# Patient Record
Sex: Female | Born: 1965 | Race: White | Hispanic: No | Marital: Married
Health system: Midwestern US, Academic
[De-identification: ages and names within clinical notes are randomized; demographics above are authoritative.]

---

## 2016-03-21 IMAGING — US PEL
1 series · 14 of 25 positions shown · non-contrast
Comparison: none

[Series 1: us pelvic complete · 14 of 49 slices shown]
[im 1/49]
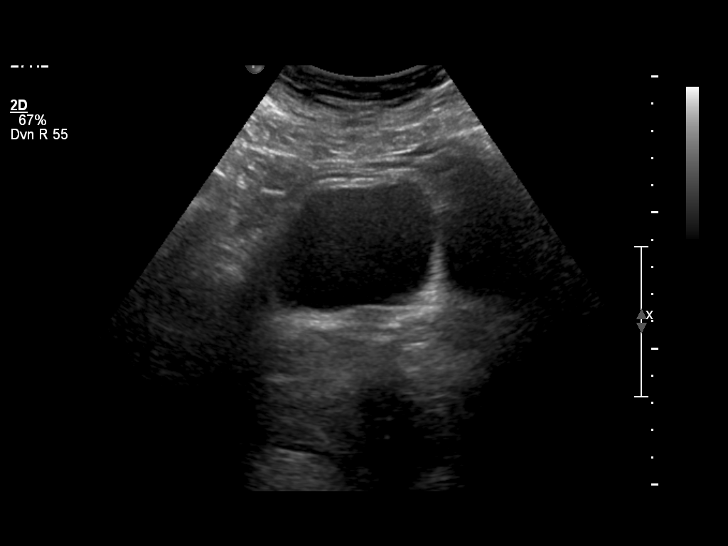
[im 5/49]
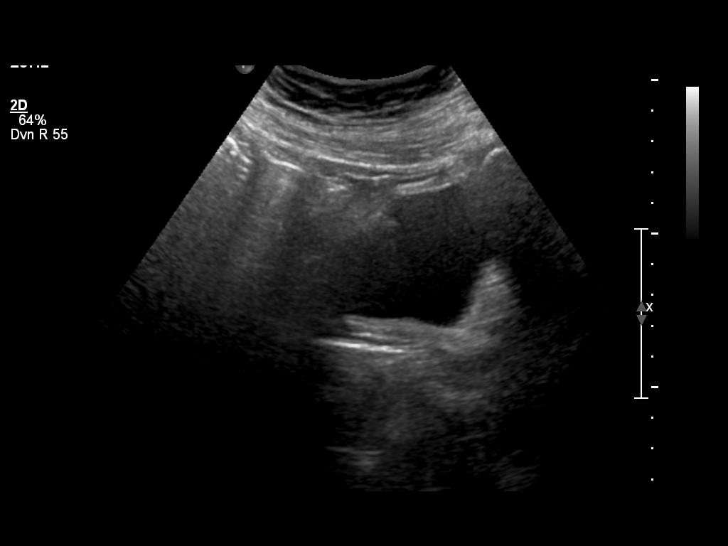
[im 9/49]
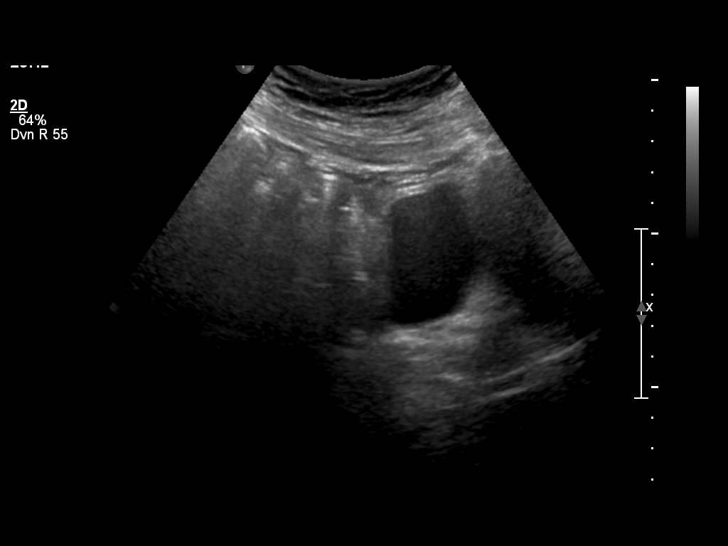
[im 13/49]
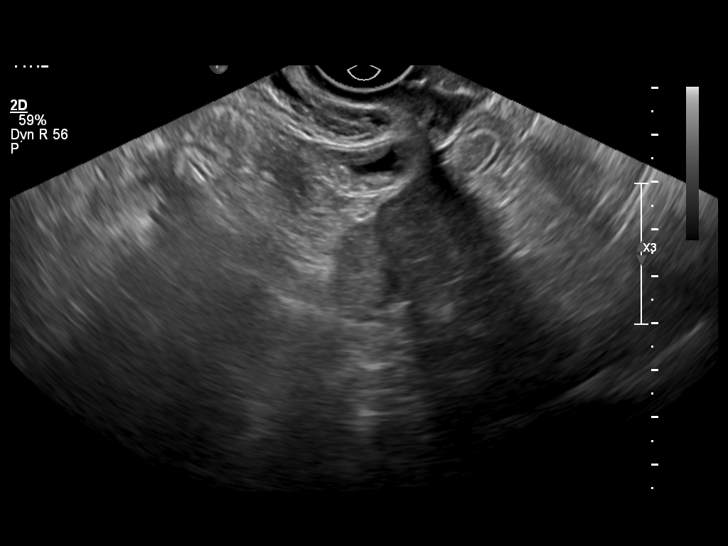
[im 17/49]
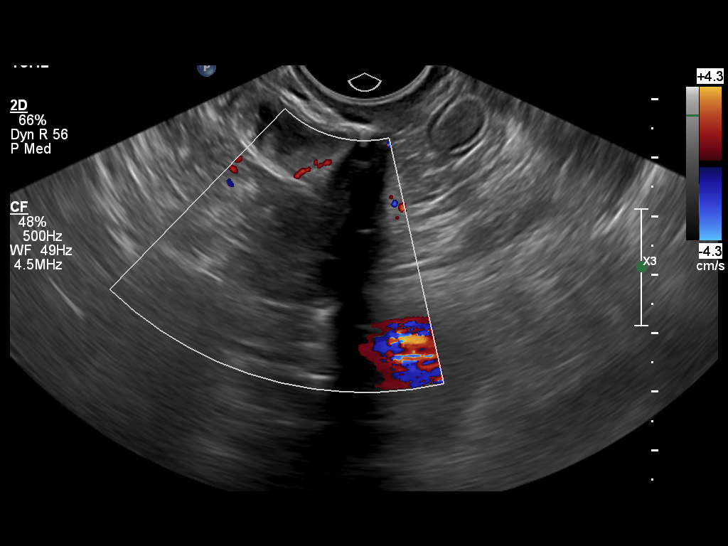
[im 19/49]
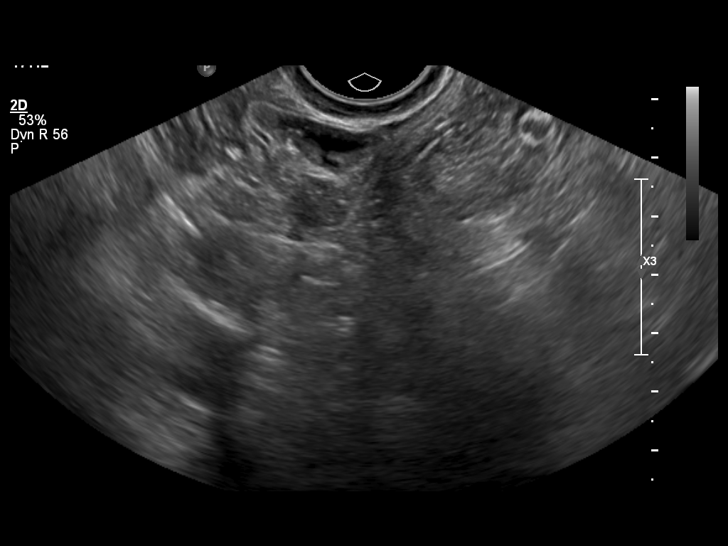
[im 23/49]
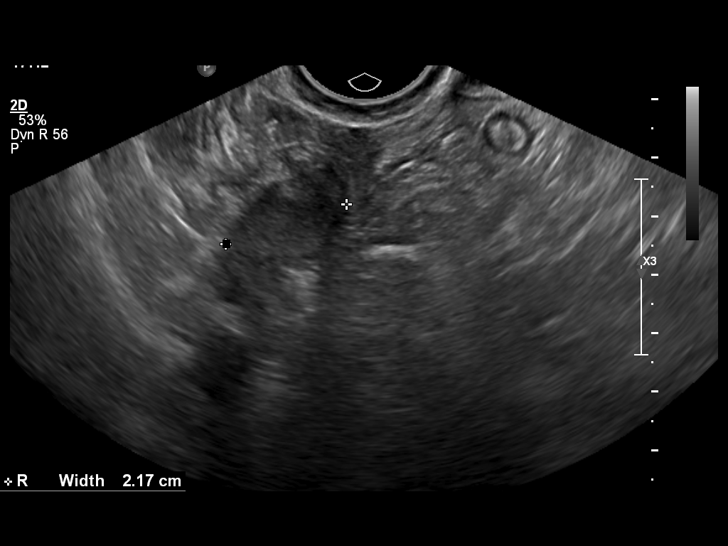
[im 27/49]
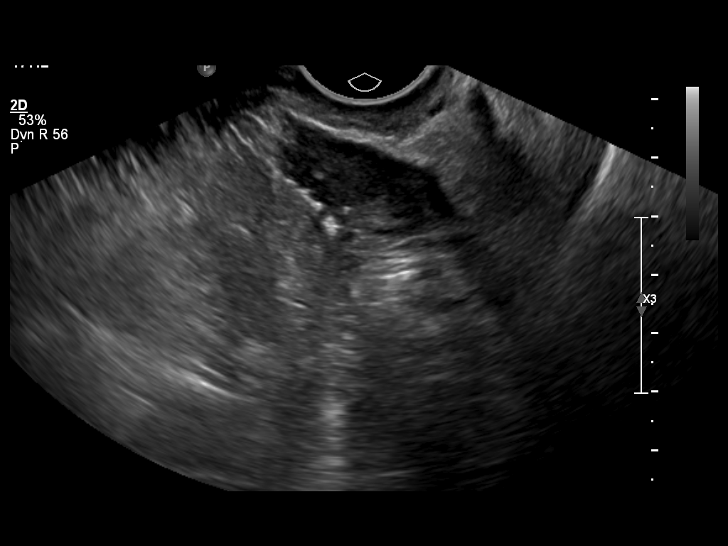
[im 31/49]
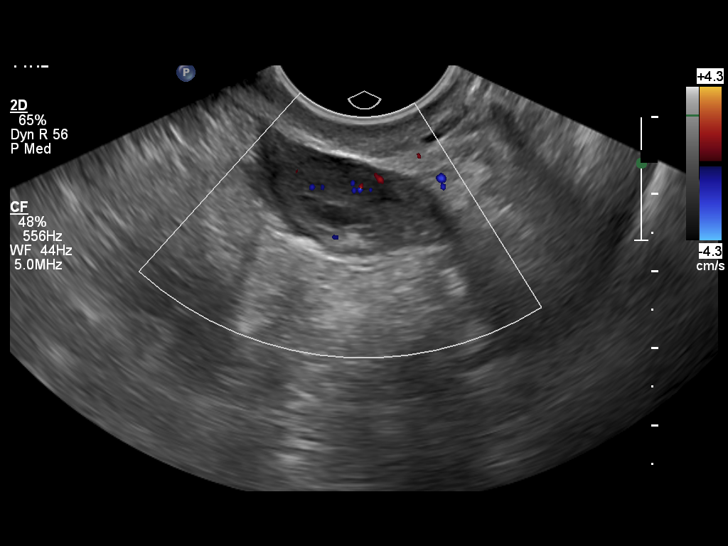
[im 33/49]
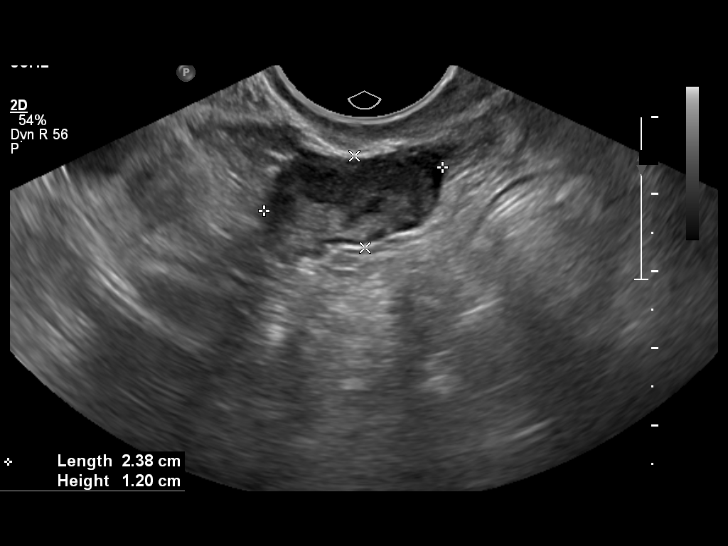
[im 37/49]
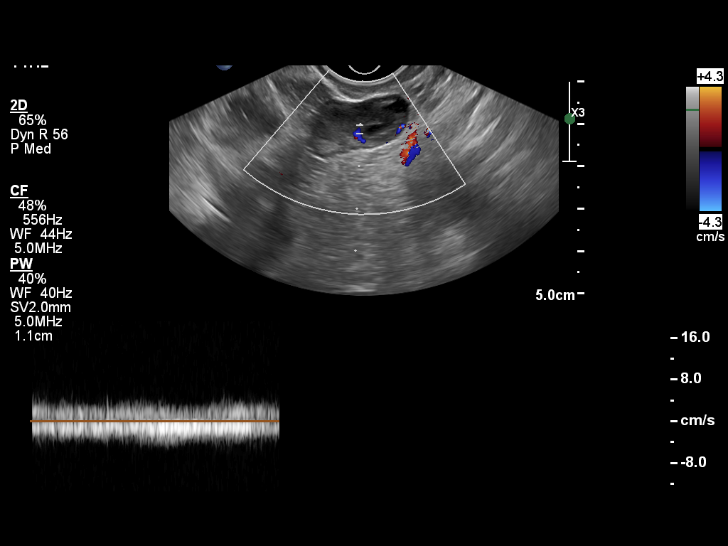
[im 41/49]
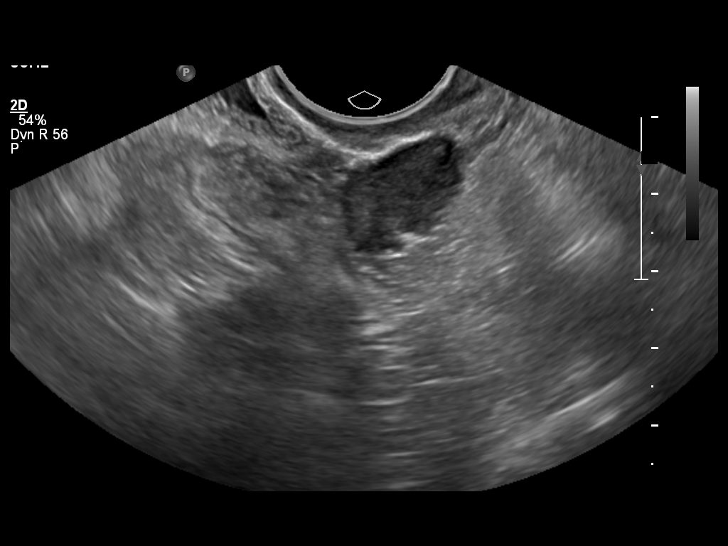
[im 45/49]
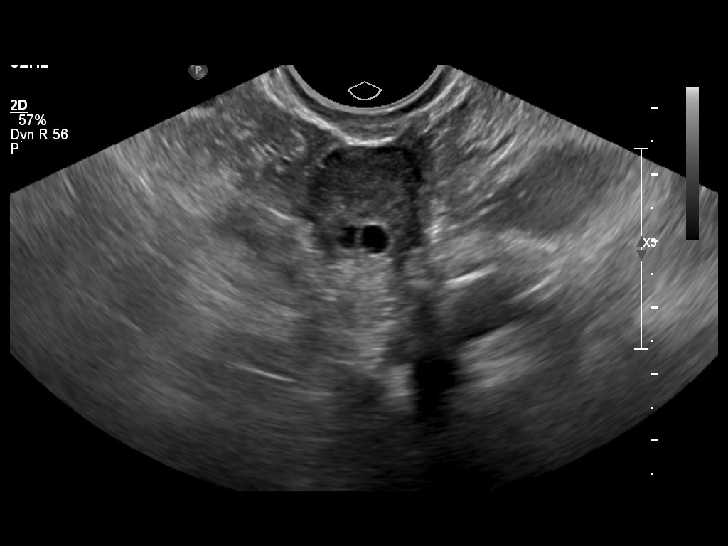
[im 49/49]
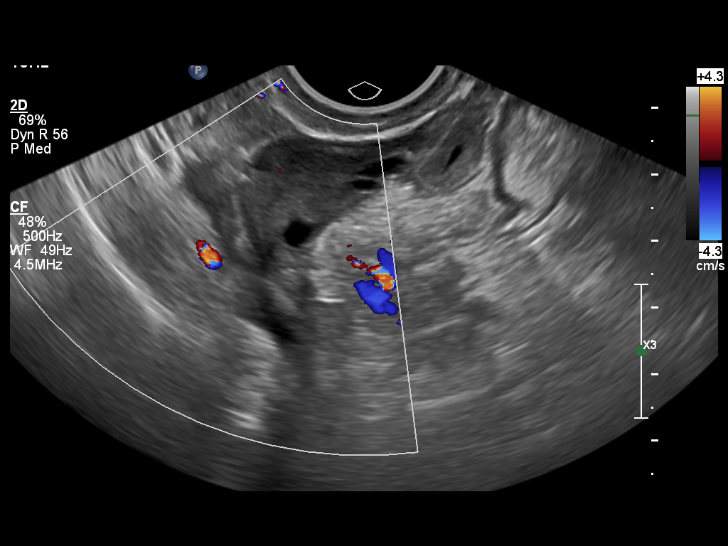

[14 of 25 positions shown; findings below may reference images not displayed]

ULTRASOUND REPORT

EXAM
ULTRASOUND, PELVIC (NONOBSTETRIC), REAL TIME WITH IMAGE DOCUMENTATION, COMPLETE, CPT 49119;
ULTRASOUND, TRANSVAGINAL, CPT 34475

INDICATION
PELVIC PAIN

TECHNIQUE
Multiple static grayscale and color Doppler ultrasound images provided from a transabdominal and
transvaginal pelvic ultrasound.

COMPARISONS
None

FINDINGS
The uterus is surgically absent. The right ovary measures5.8 x 2.2 x 2 cm. The left ovary
measures5.8 x 2.3 x 1.2cm . There are no solid or cystic masses identified in either ovary. There
are no adnexal masses. There is normal blood flow to the ovaries identified bilaterally.

IMPRESSION
Surgically absent uterus. Normal ovaries.

## 2017-03-22 IMAGING — CR ABDOMEN
2 series · 2 of 2 positions shown · non-contrast
Comparison: none

[abdomen upright]
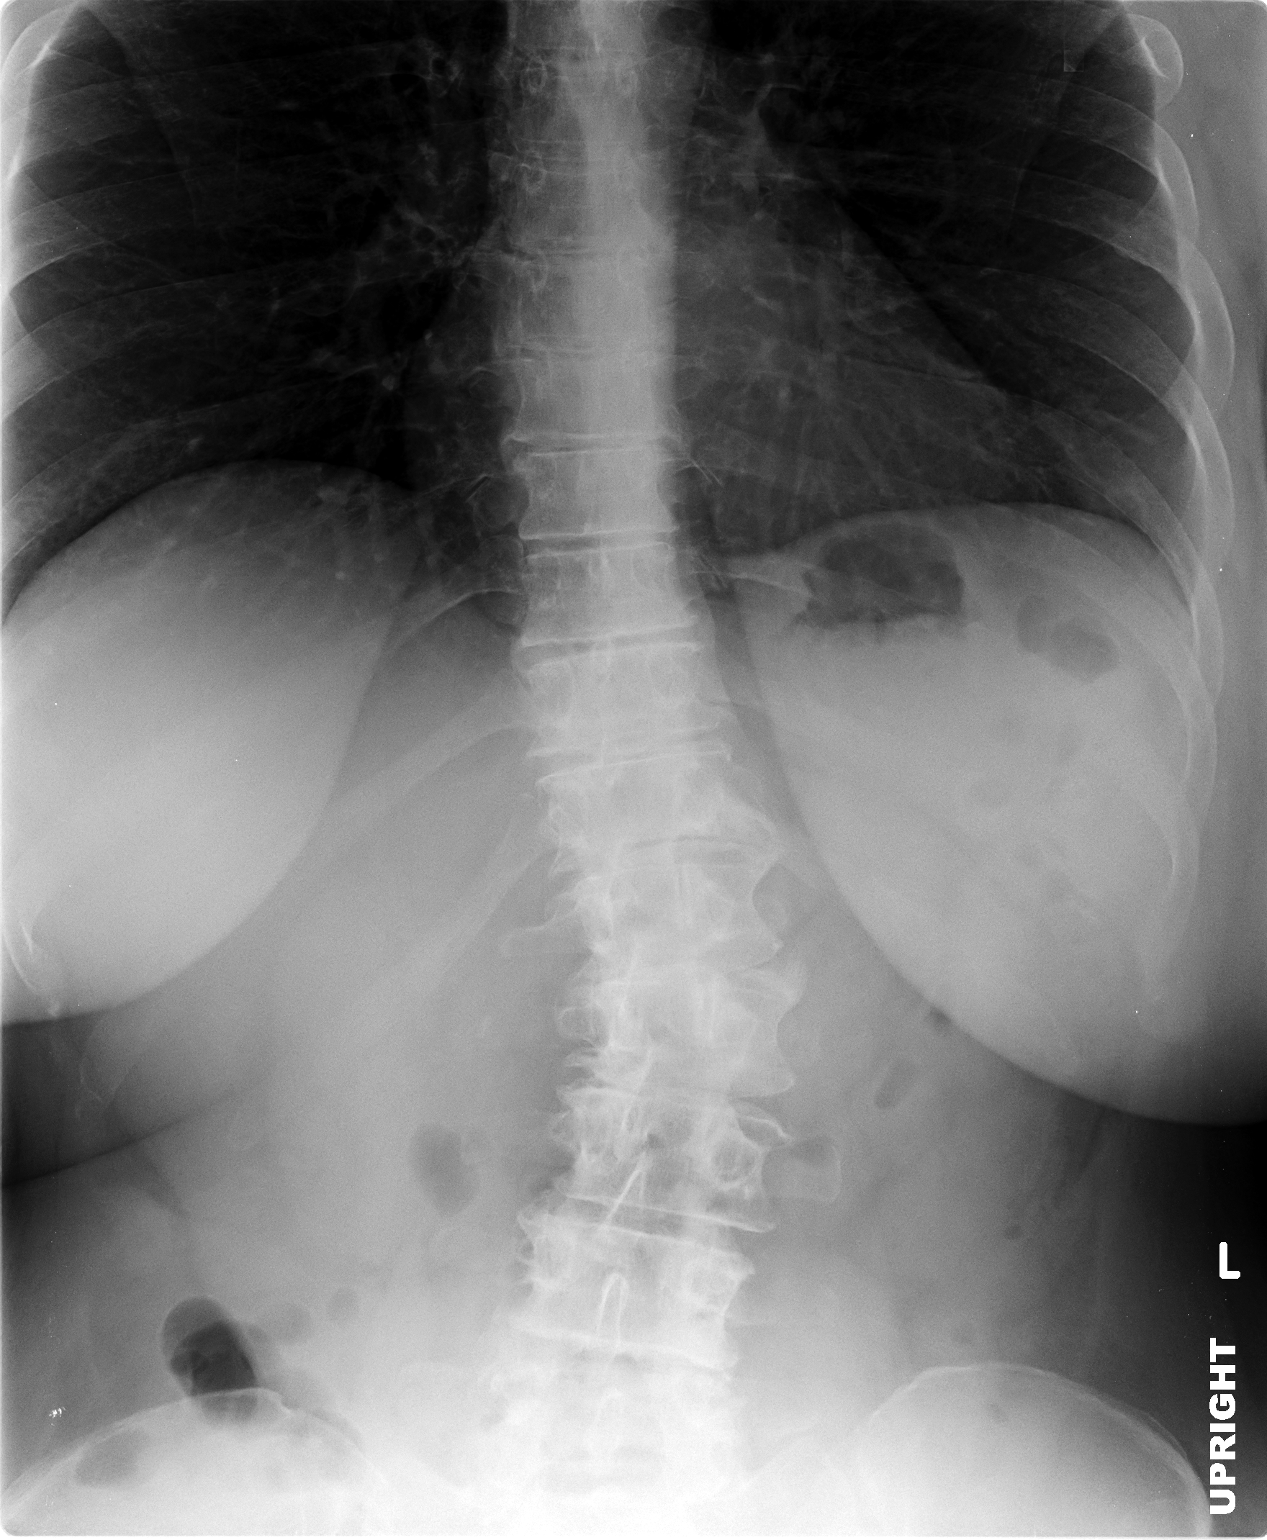

[abdomen supine kub]
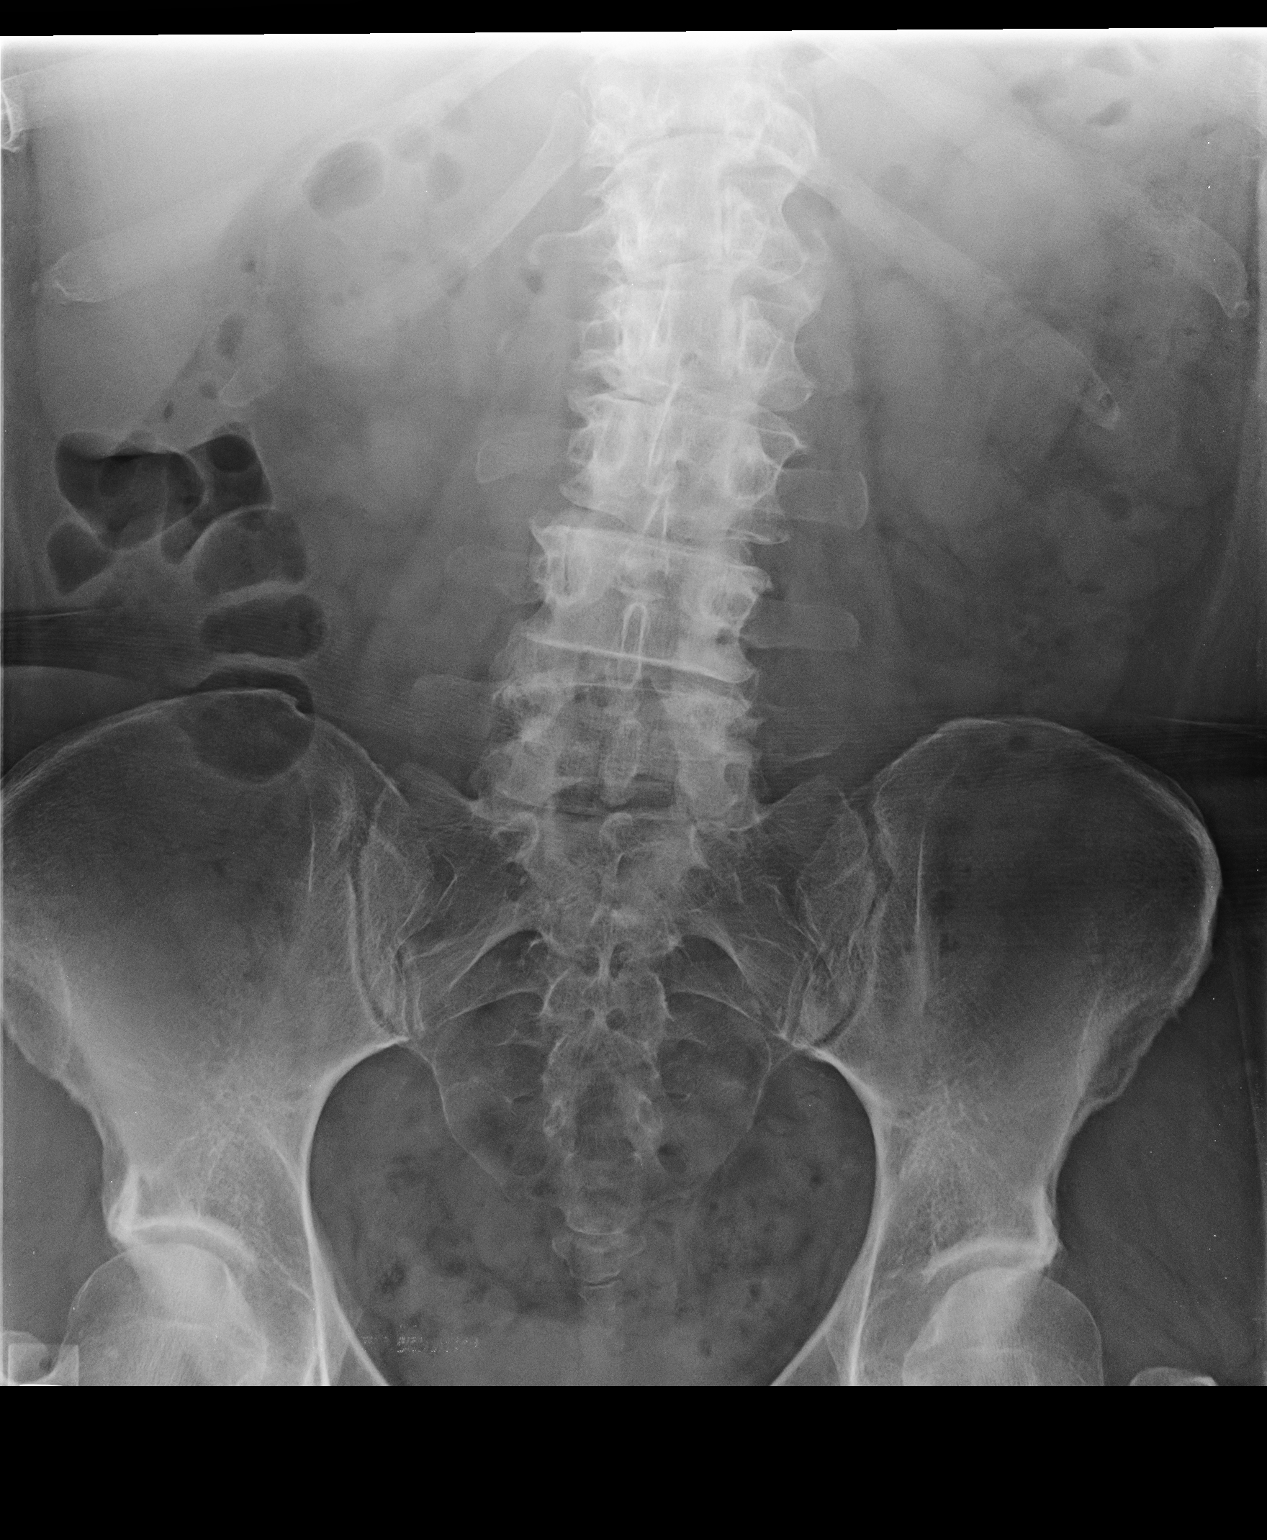

[2 of 2 positions shown; findings below may reference images not displayed]

EXAM

Two-view abdomen.

INDICATION

Pain.

FINDINGS

There is no evidence of obstruction or ileus. No free air is seen.

Stool is seen increased in the colon.

There is scoliosis and degenerative changes of the lumbar spine.

IMPRESSION

No obstruction or free air.

## 2018-01-15 IMAGING — CT Abdomen^1_ABDOMEN_PELVIS_WITH (Adult)
1 series · 15 of 32 positions shown, 19 images · IV contrast (APPLIED)
Comparison: none

[Series 2: abd/pelvis with 5.0 soft tissue · axial · 0.80mm/px · z∈[-457,-22]mm · 15 of 98 slices shown, 19 images]
[im 7/98  soft-tissue]
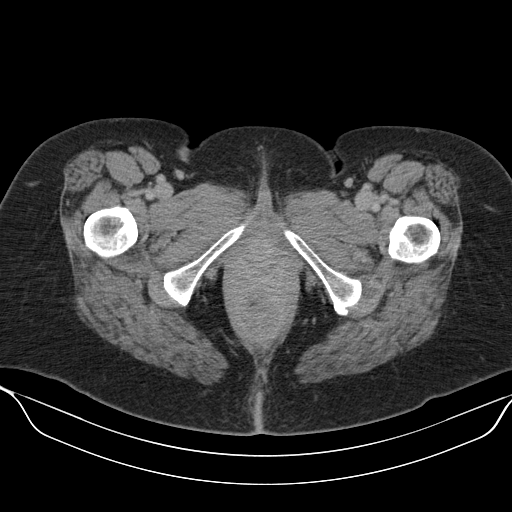
[im 7/98  bone]
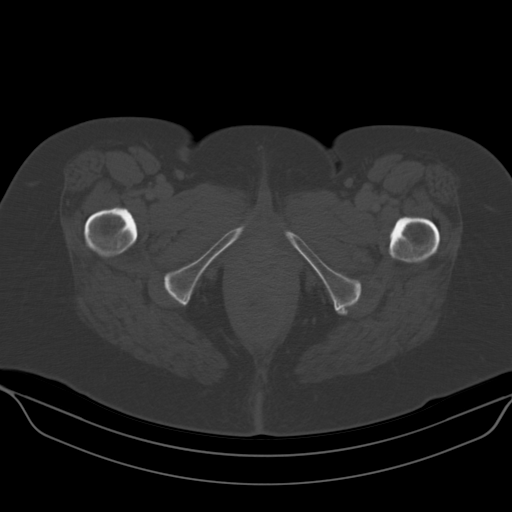
[im 13/98  soft-tissue]
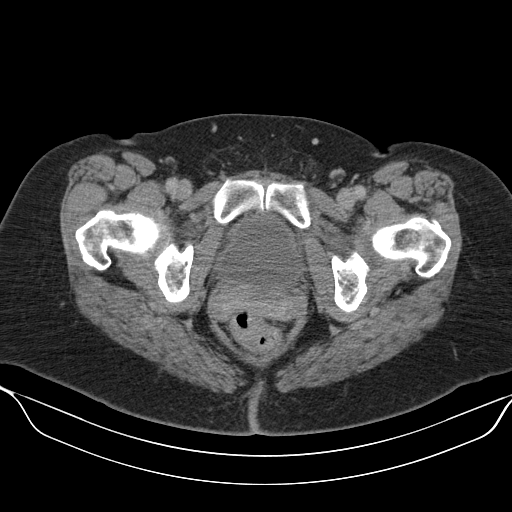
[im 19/98  soft-tissue]
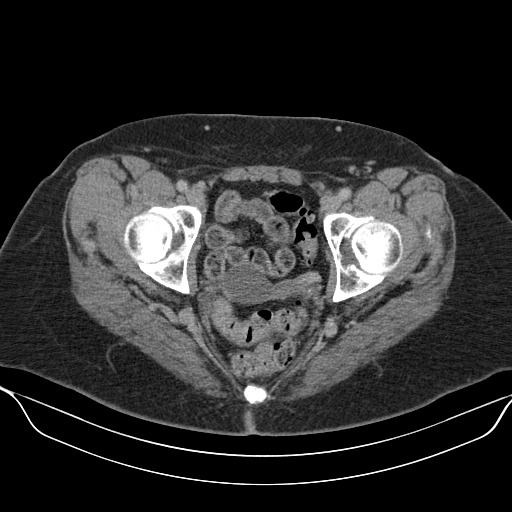
[im 29/98  soft-tissue]
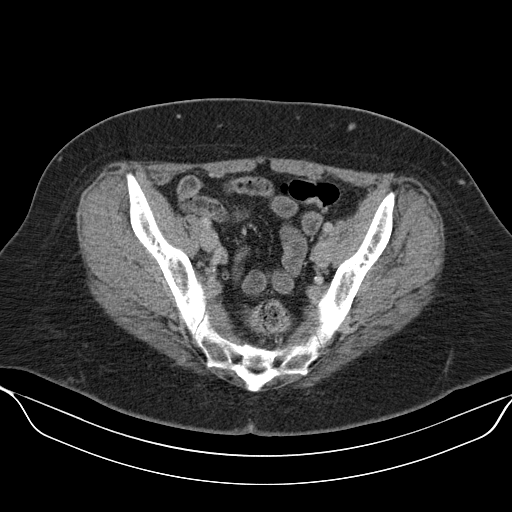
[im 35/98  soft-tissue]
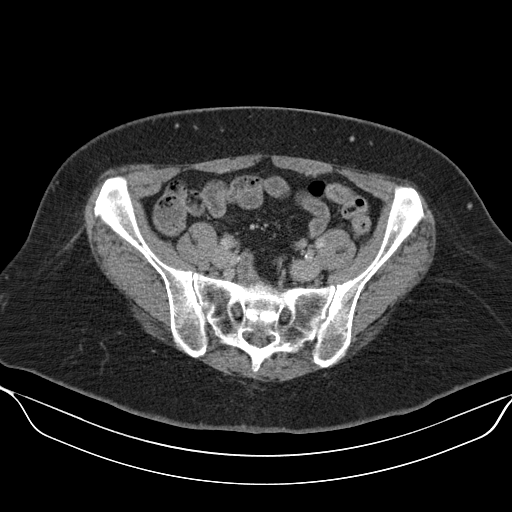
[im 41/98  soft-tissue]
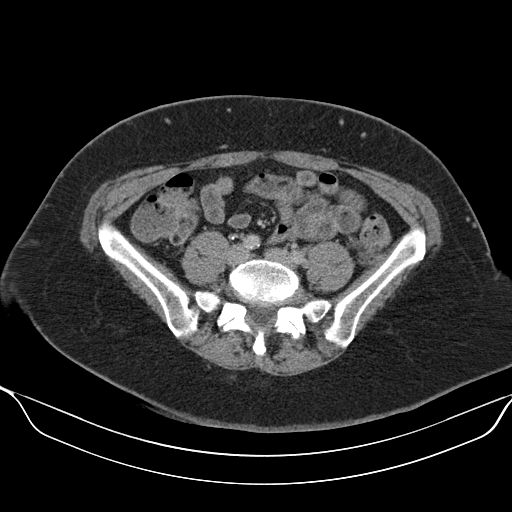
[im 51/98  soft-tissue]
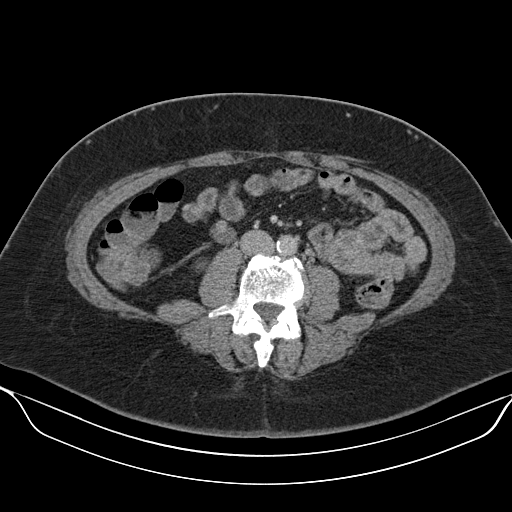
[im 57/98  soft-tissue]
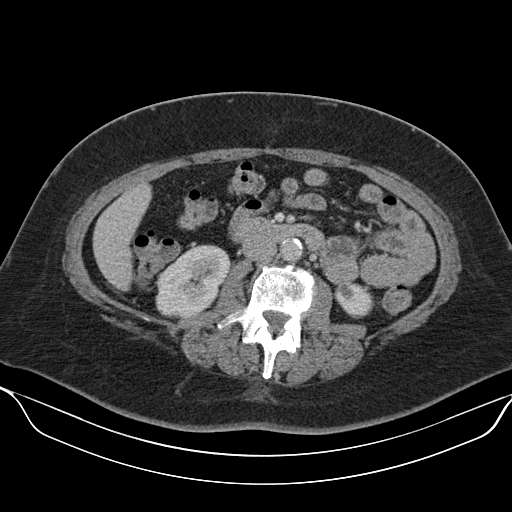
[im 63/98  soft-tissue]
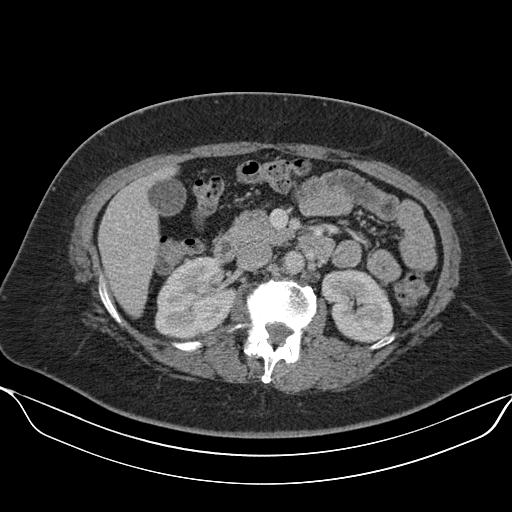
[im 63/98  bone]
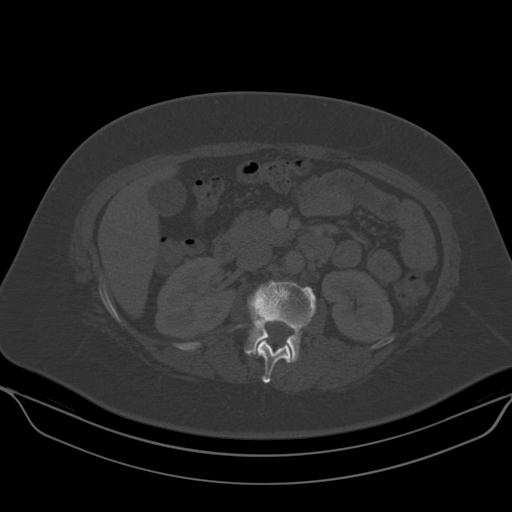
[im 69/98  soft-tissue]
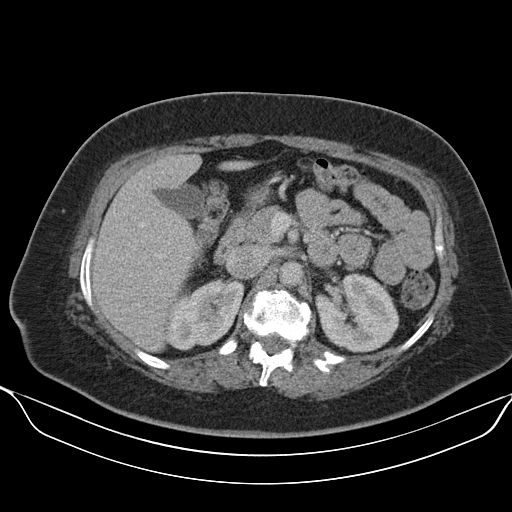
[im 79/98  soft-tissue]
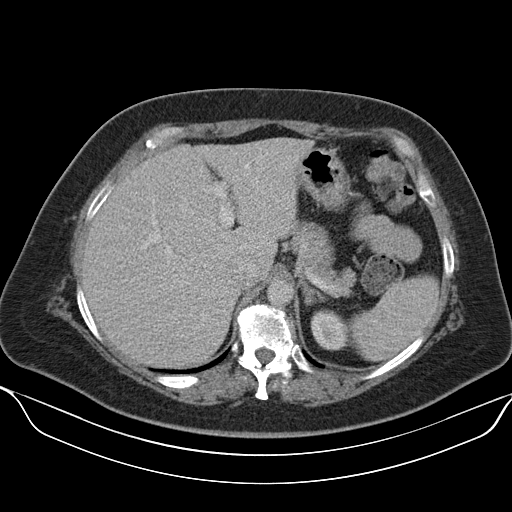
[im 85/98  soft-tissue]
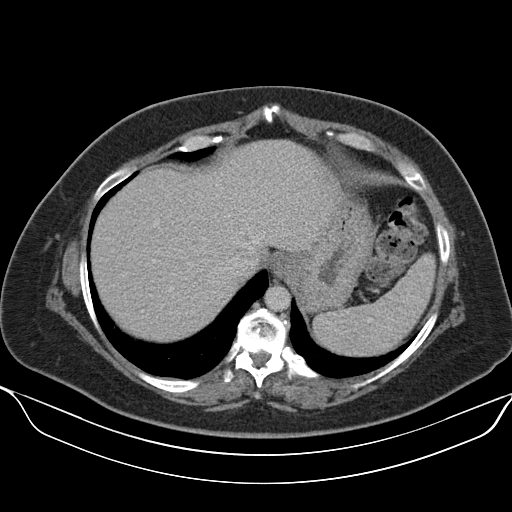
[im 85/98  lung]
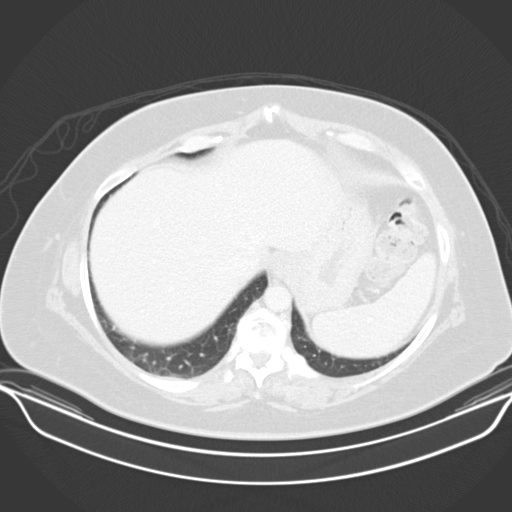
[im 88/98  lung]
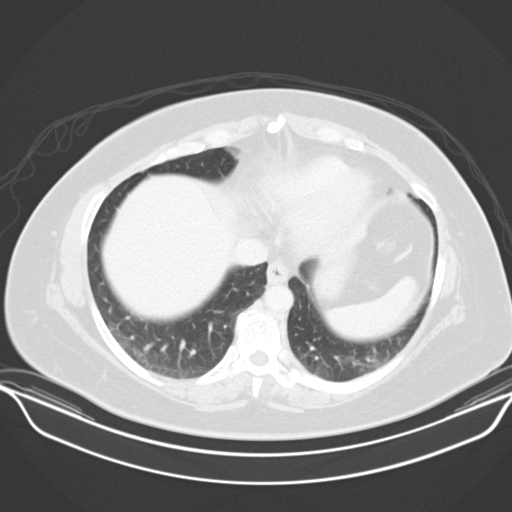
[im 91/98  soft-tissue]
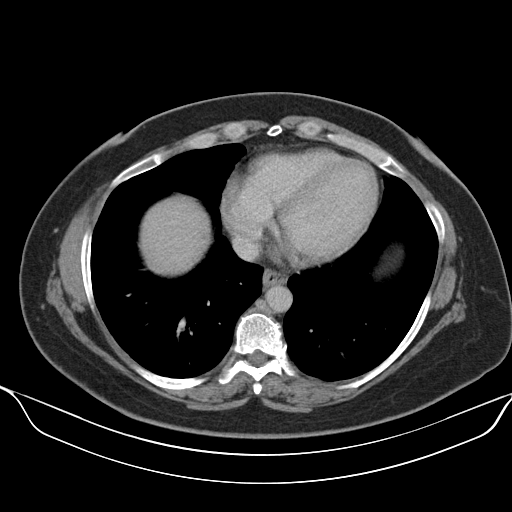
[im 91/98  lung]
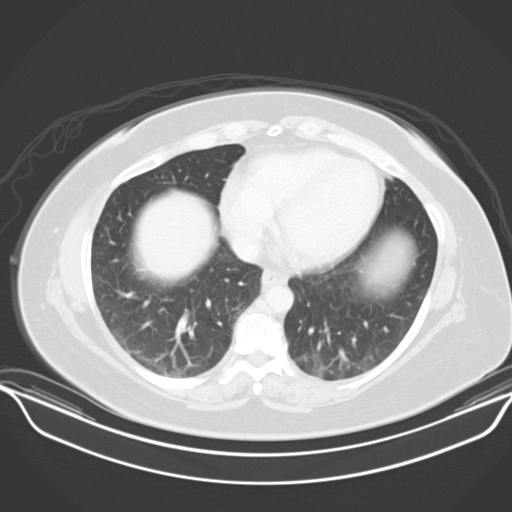
[im 94/98  lung]
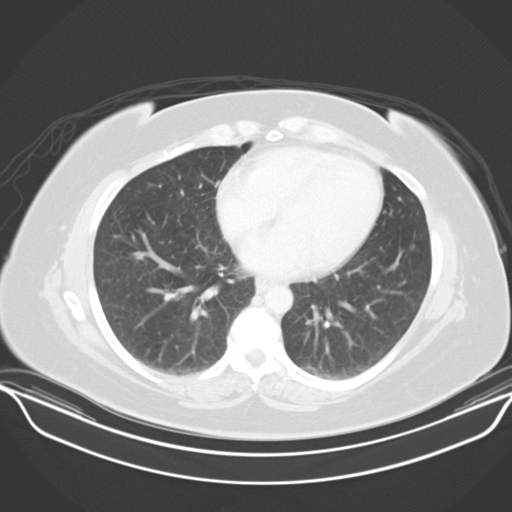

[15 of 32 positions shown; findings below may reference images not displayed]

DIAGNOSTIC STUDIES

EXAM
CT ABDOMEN AND PELVIS WITH CONTRAST

INDICATION
Generalized abdominal pain.

TECHNIQUE
Contiguous transaxial imaging through the abdomen and pelvis were obtained during the uneventful
administration of544 milliliter Omnipaque 300 low osmolar intravenous iodinated contrast material.
Coronal and sagittal reformations were obtained from the transaxial source data.

All CT scans at this facility use dose modulation, iterative reconstruction, and/or weight based
dosing when appropriate to reduce radiation dose to as low as reasonably achievable.

COMPARISONS
No comparison is available

FINDINGS
The lung bases are clear and the heart size is normal. No free intraperitoneal gas is detected.

The liver and spleen are normal size and contour without focal lesion. There is no radiopaque
cholelithiasis or biliary ductal dilatation. No adrenal masses and the pancreas is unremarkable. The
kidneys concentrate iodinated contrast material normally and are without enhancing mass or
hydronephrosis. Sub centimeter round renal cortical hypodense lesions are too small to characterize
but likely cysts.

The abdominal aorta and inferior vena cava are normal caliber. The urinary bladder is distended and
nonopacified. Uterus absent. A right pelvic cyst 4.1 x 3.3 centimeter. There is no abdominopelvic
lymphadenopathy or ascites. The large and small bowel loops are normal caliber.
Mild colonic diverticulosis without pericolonic inflammatory fat stranding or fluid. Normal
appendix.
No destructive bone lesion is identified. Multilevel degenerative disc disease and mild left
convexity scoliotic curvature.

IMPRESSION
1. No acute inflammatory or obstructive abnormality is identified.
2. Right pelvic cyst is probably a benign ovarian cyst. If there are concerning associated
symptoms, consider pelvic ultrasound.
3. Mild colonic diverticulosis without pericolonic inflammatory fat stranding or fluid.

Tech Notes:
OMNIPAQUE 300

## 2018-01-17 IMAGING — CR PELVIS
3 series · 3 of 3 positions shown · non-contrast
Comparison: none

[t-spine ap]
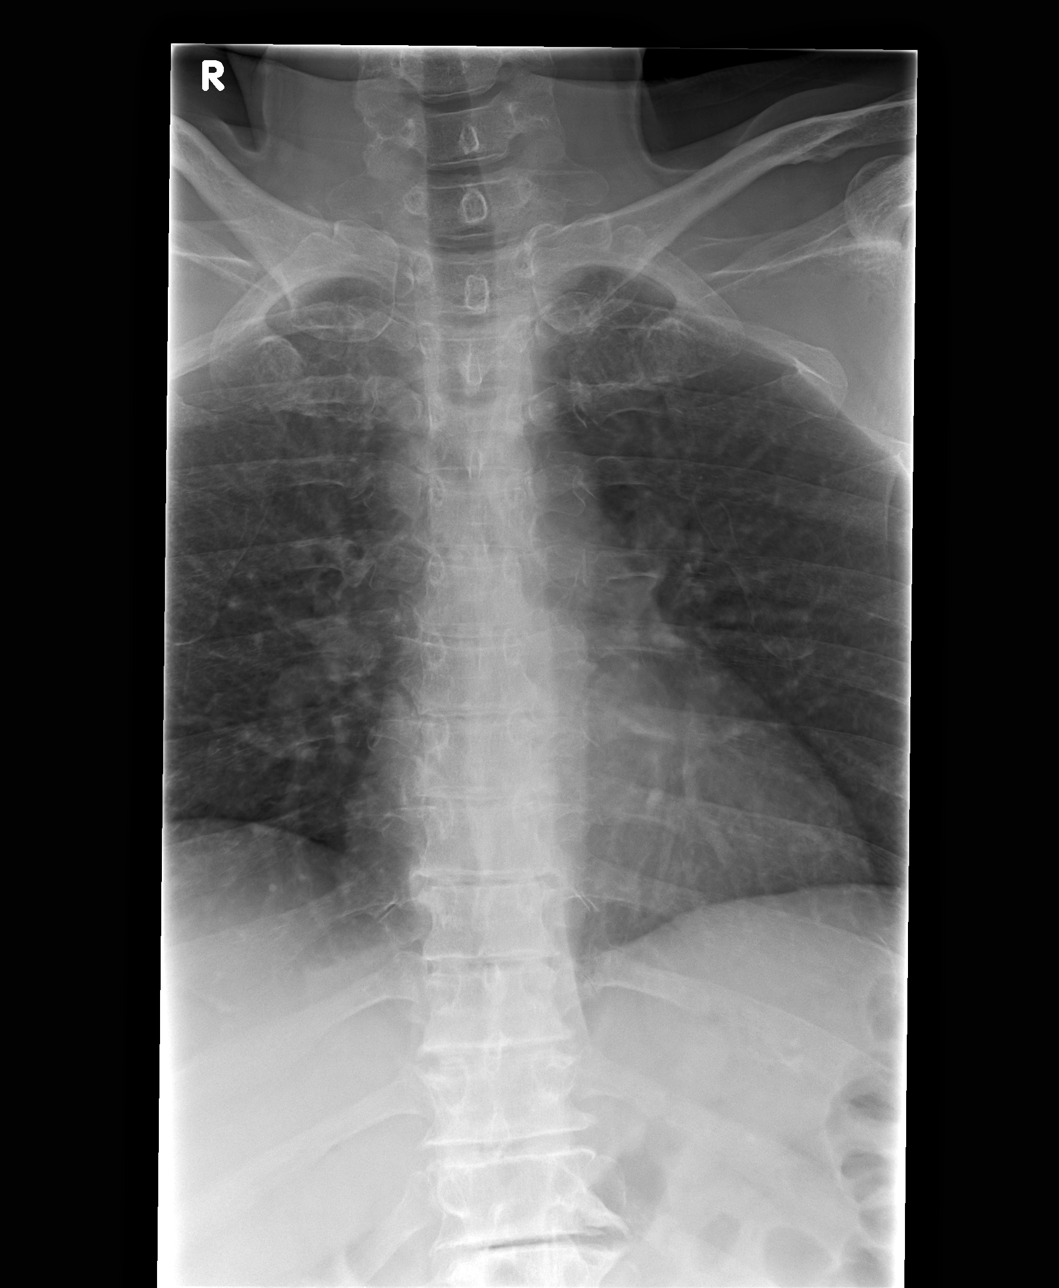

[t-spine lat]
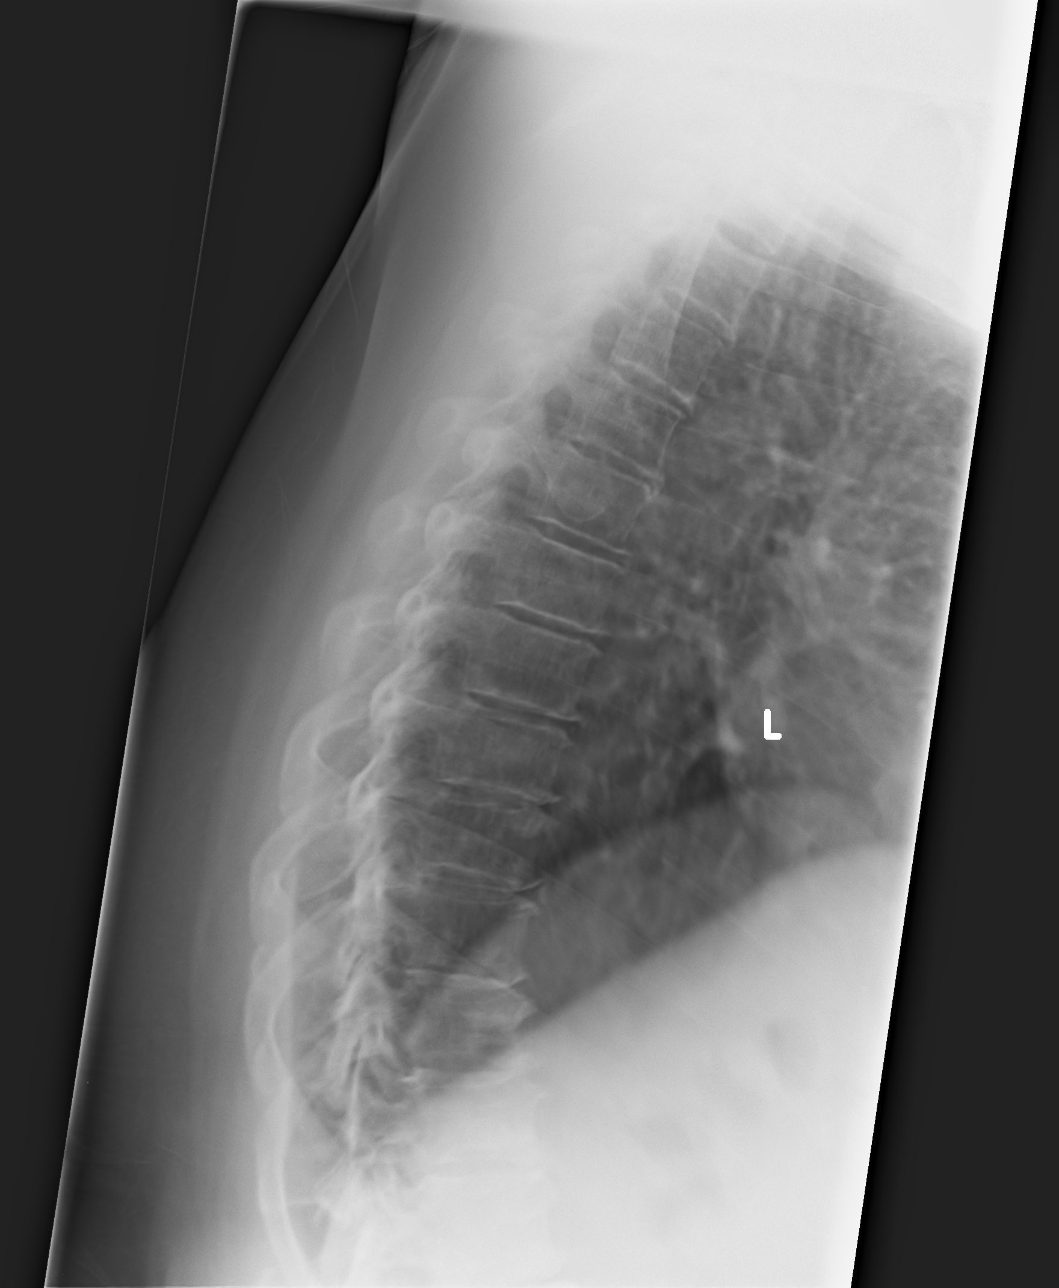

[swimmers]
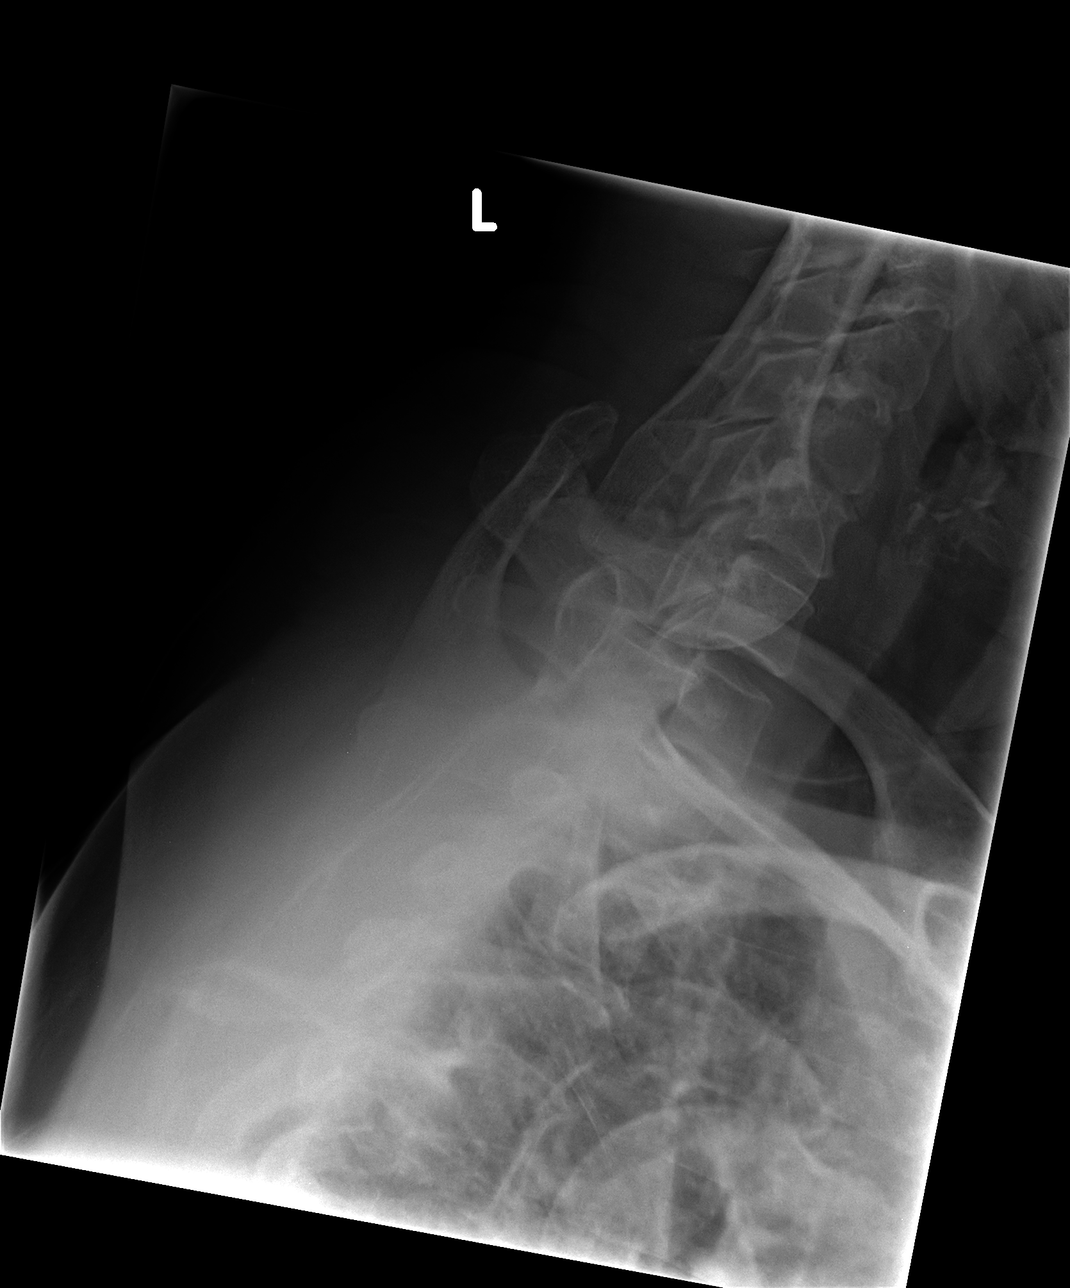

[3 of 3 positions shown; findings below may reference images not displayed]

DIAGNOSTIC STUDIES

EXAM
RADIOLOGIC EXAMINATION, SPINE; THORACIC, 3 VIEWS, CPT 23923

INDICATION
Pain.

TECHNIQUE
AP, lateral, and swimmer's views were obtained.

COMPARISONS
No priors available for comparison.

FINDINGS
[There is minimal convexity to the right with an apex at the level of T7 and a Cobb angle of 3
degrees. The pedicles appear intact. No acute displaced fracture. Mild multilevel degenerative
changes.

IMPRESSION
Mild multilevel degenerative changes.
]Further evaluation with MRI if clinically indicated.

Tech Notes:

MID BACK PAIN WRAPPING AROUND TO LEFT SIDE OF ABDOMEN. NO KNOWN INJURY. ME

## 2019-01-07 IMAGING — CR ABDOMEN
1 series · 1 of 1 positions shown · non-contrast
Comparison: none

[abdomen supine kub]
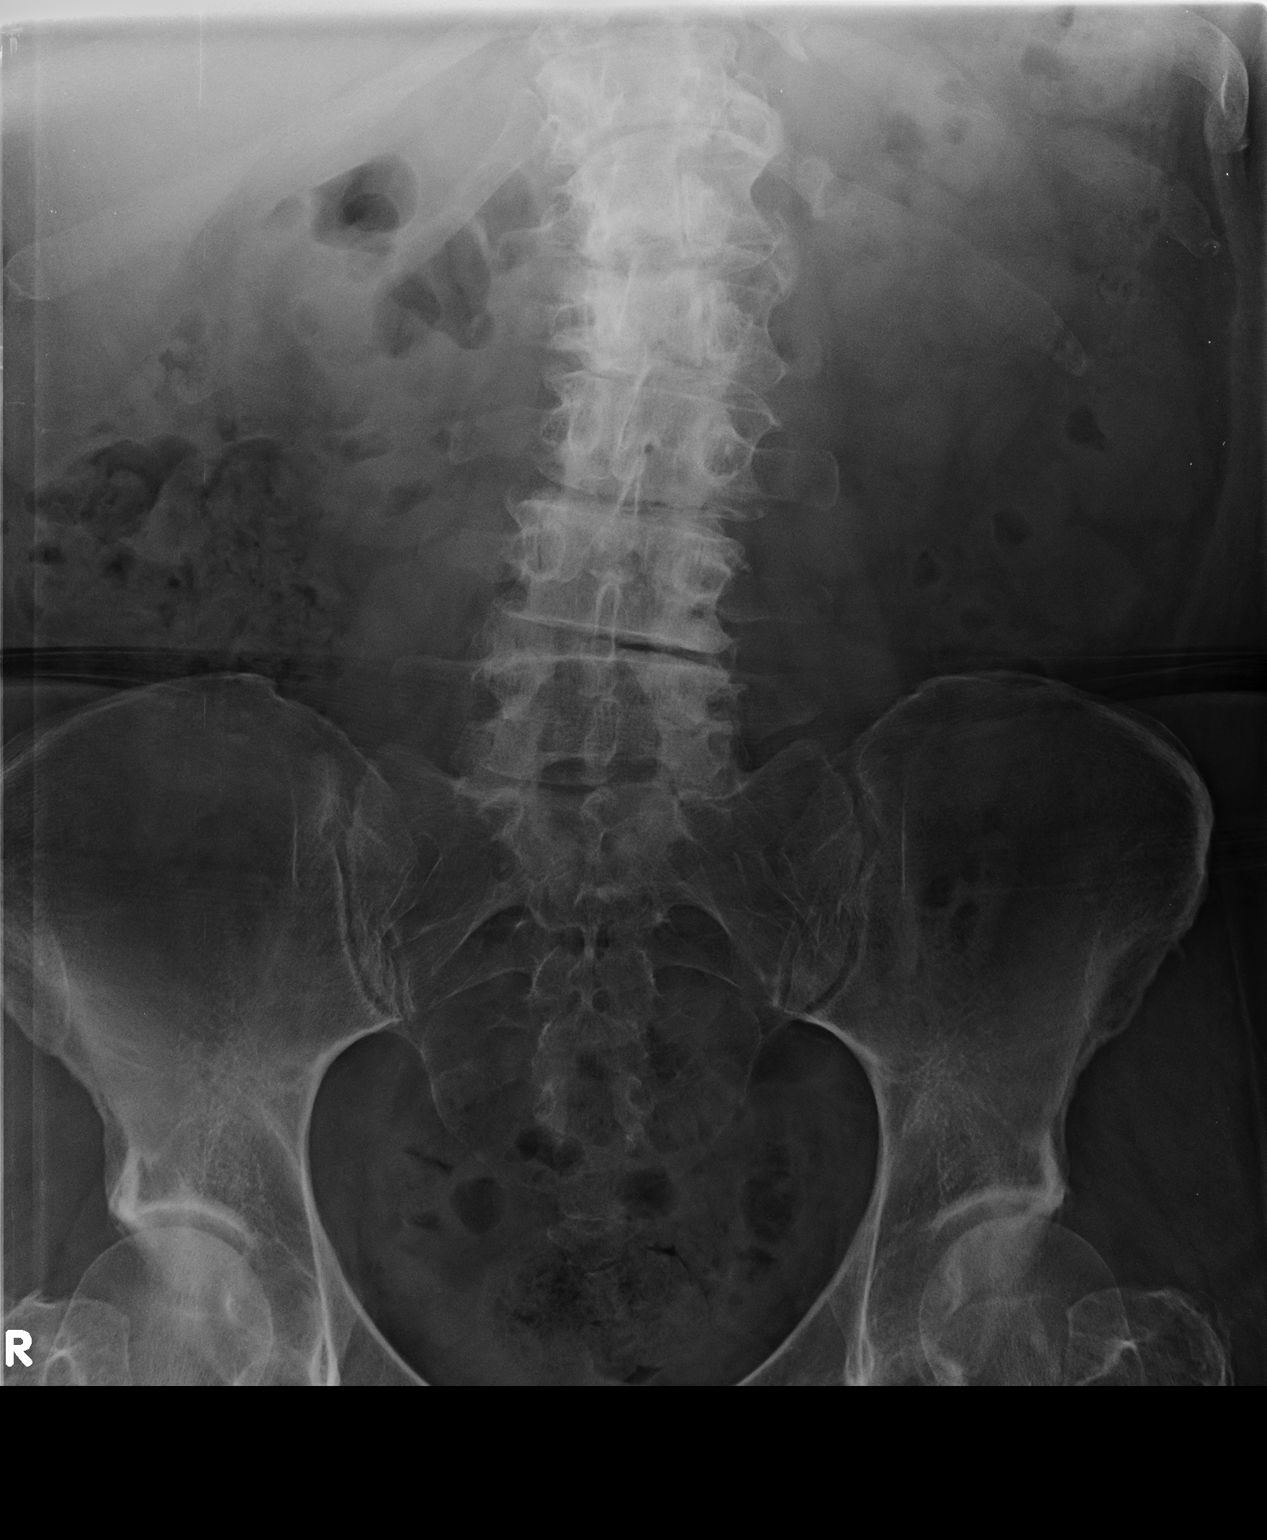

[1 of 1 positions shown; findings below may reference images not displayed]

DIAGNOSTIC STUDIES

EXAM

Single-view abdomen

INDICATION

abd. pain
PT c/o upper and lower anterior abd pain. Denied pregnancy, h/o hysterectomy. JC/TM

TECHNIQUE

Single AP view abdomen

COMPARISONS

No prior studies are available for comparison.

FINDINGS

There is a nonspecific, nonobstructive bowel gas pattern.  There is a  mild  amount of intracolonic
fecal distention.  There is no evidence of free air.  There is no pathologic calcification or acute
osseous abnormality.

IMPRESSION

1.  Nonspecific, nonobstructive bowel gas pattern. Moderate stool.

Tech Notes:

PT c/o upper and lower anterior abd pain. Denied pregnancy, h/o hysterectomy. JC/TM

## 2019-02-10 IMAGING — CR ABDOMEN
2 series · 2 of 2 positions shown · non-contrast
Comparison: none

[abd supine x-wise (1 of 2)]
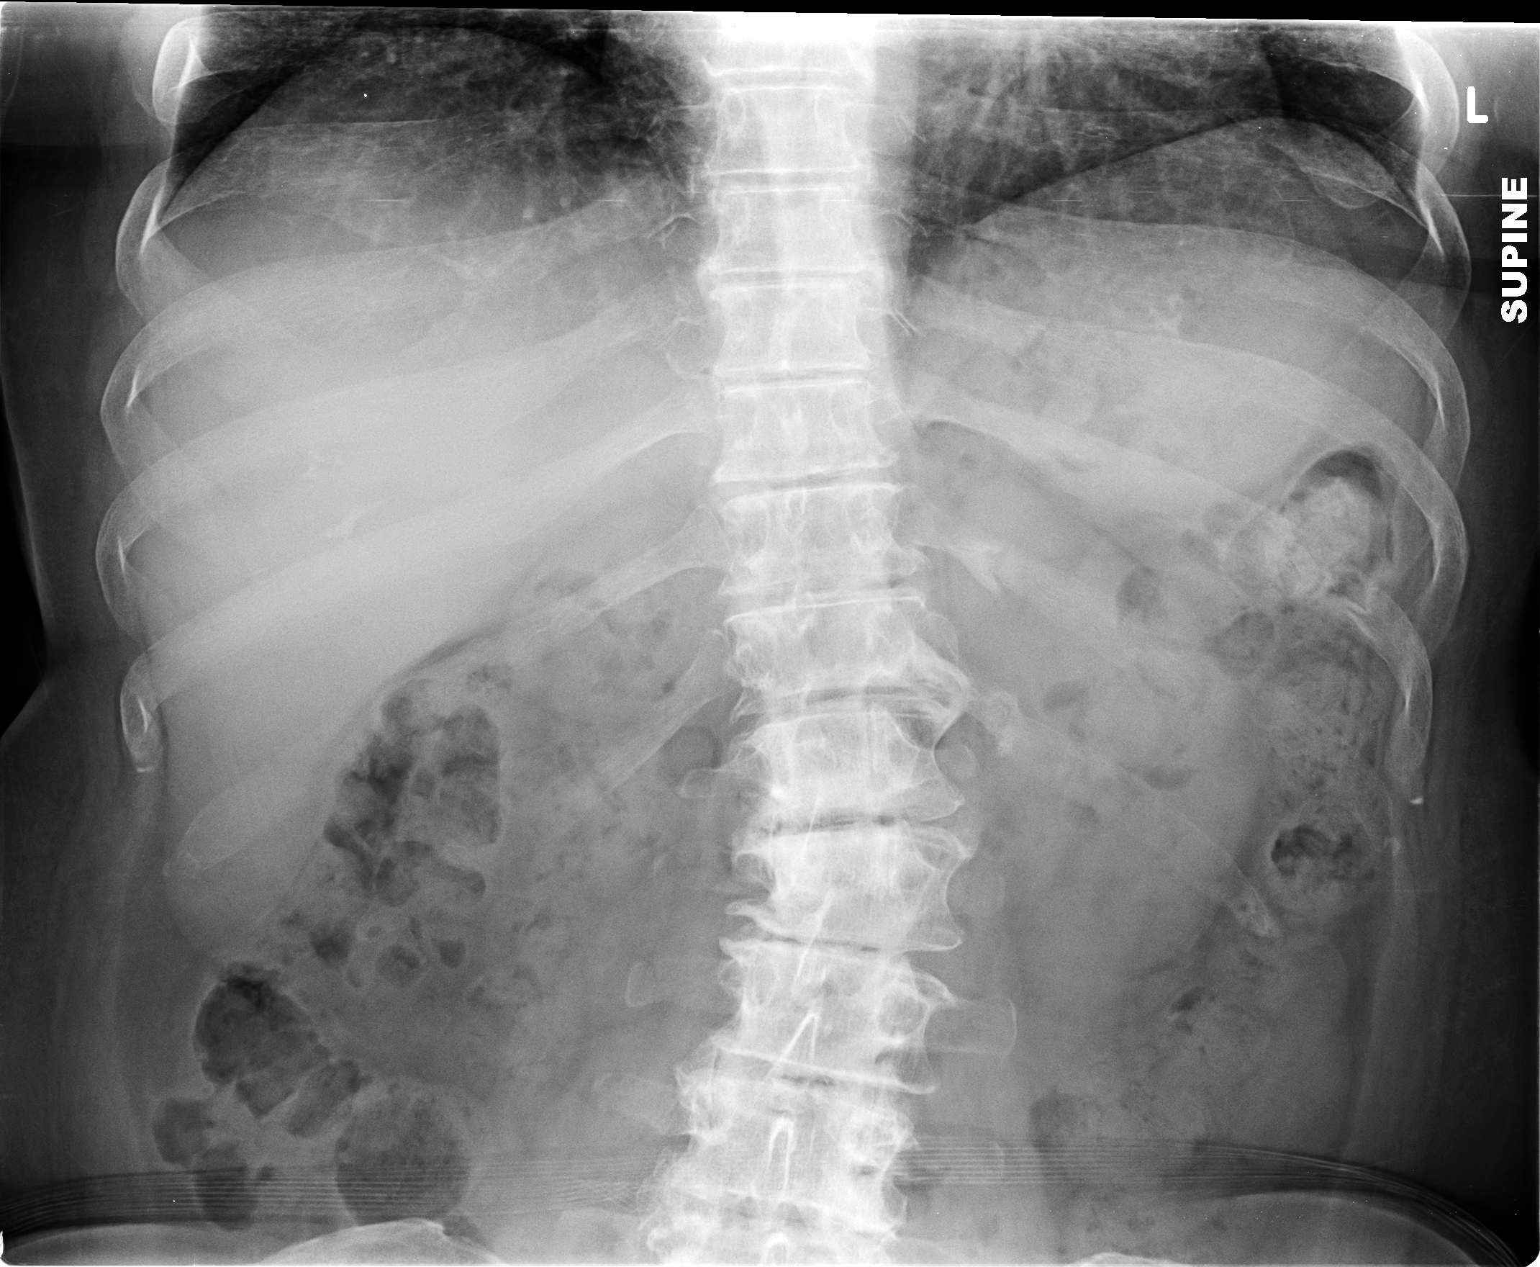

[abd supine x-wise (2 of 2)]
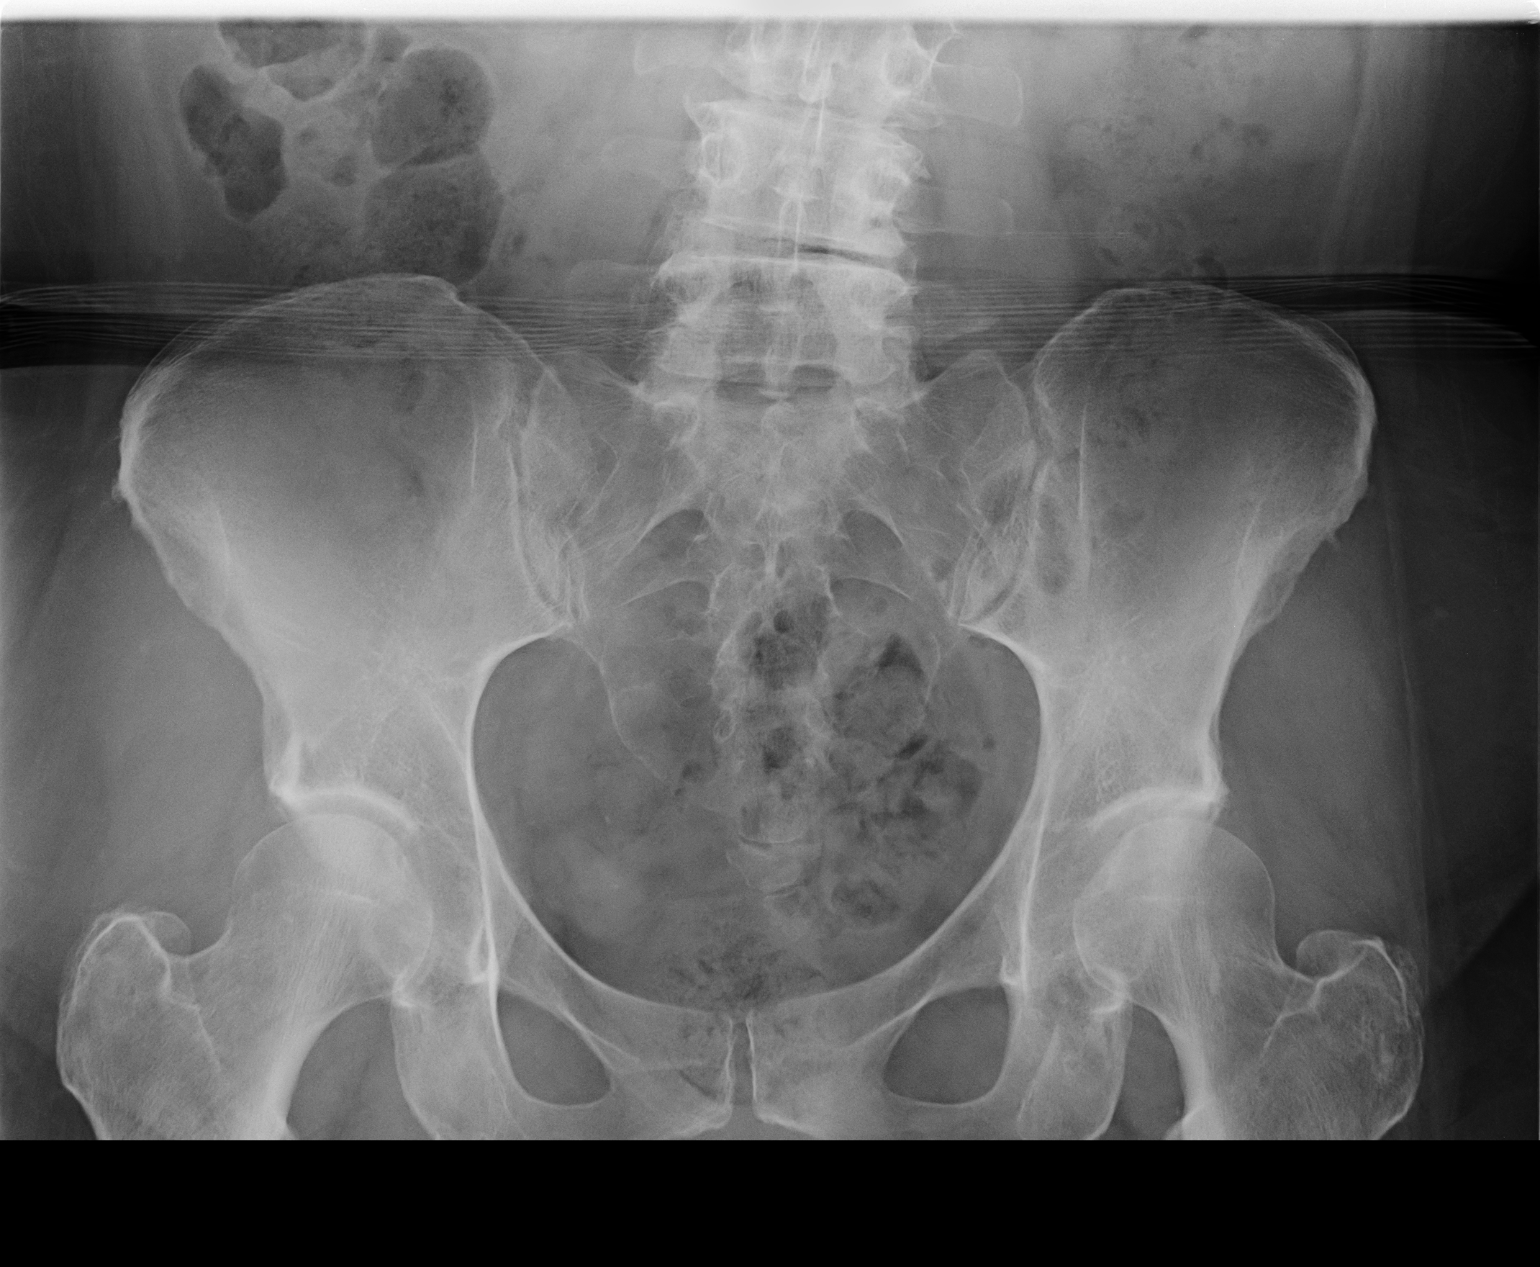

[2 of 2 positions shown; findings below may reference images not displayed]

DIAGNOSTIC STUDIES

EXAM

KUB, RADIOLOGIC EXAMINATION, ABDOMEN; SINGLE ANTEROPOSTERIOR VIEW CPT 40444

INDICATION

Abdominal pain.

TECHNIQUE

A single supine image of the abdomen was obtained.

COMPARISONS

01/07/2019

FINDINGS

Large amount of stool. No definite high-grade bowel obstruction or free air. Mild scoliosis.
Moderate to severe degenerative changes within the mid and lower lumbar spine.

IMPRESSION

Constipation. Further evaluation with CT is suggested if clinically indicated.

Tech Notes:

pt c/o of continued lower abd pain. prev xray 01/07/19 - hx partial hysterectomy - denies preg - [place]

## 2019-02-18 IMAGING — MG MAMMOGRAM 3D SCREEN, BILATERAL
12 of 15 series · 12 of 15 positions shown · non-contrast
Comparison: none

[R CC (1 of 2)]
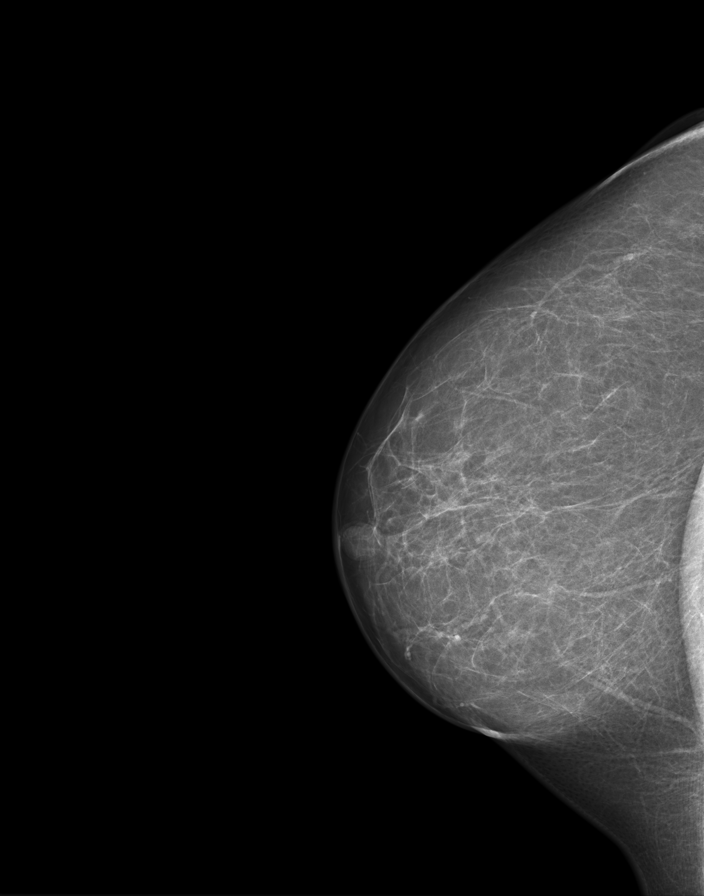

[R tomo (1 of 2)]
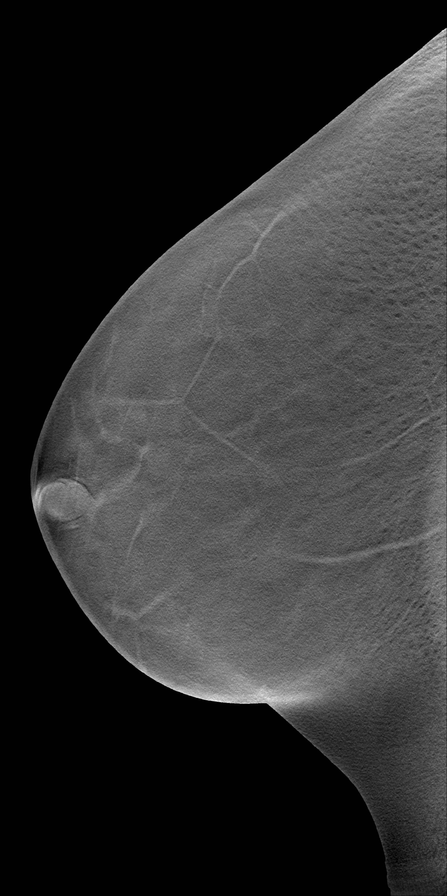

[R CC (2 of 2)]
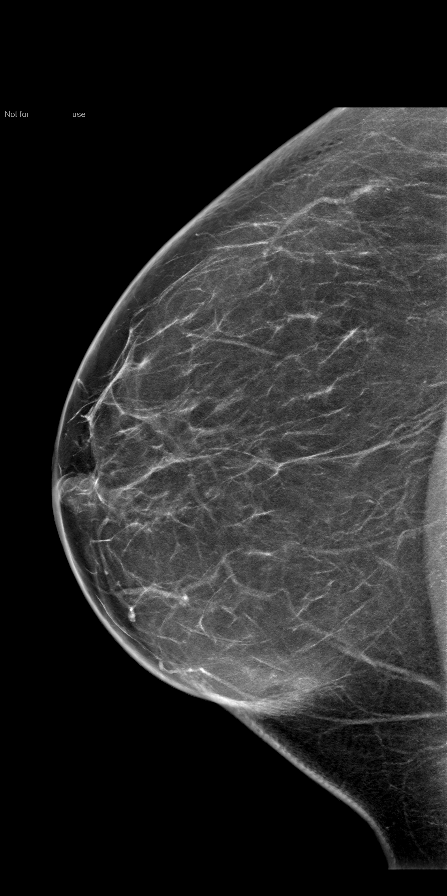

[R (1 of 2)]
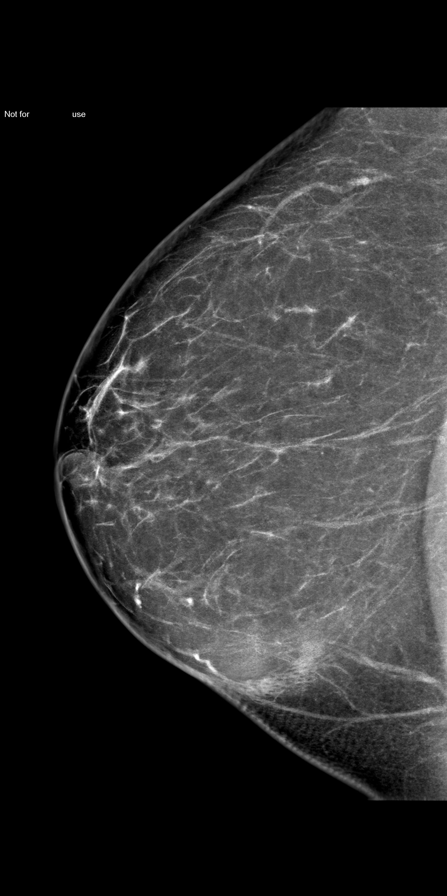

[L CC (1 of 2)]
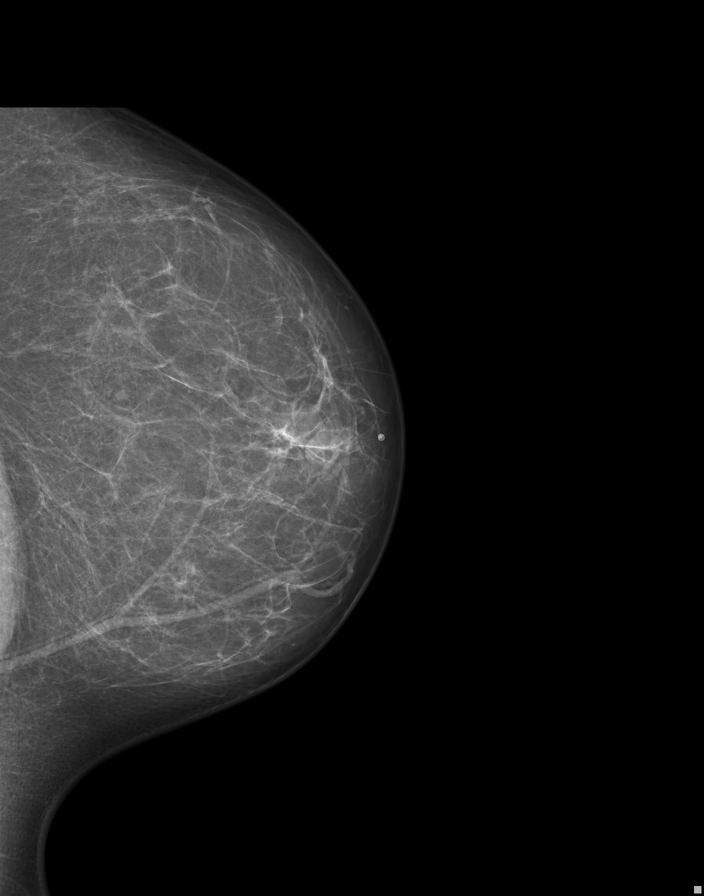

[L tomo]
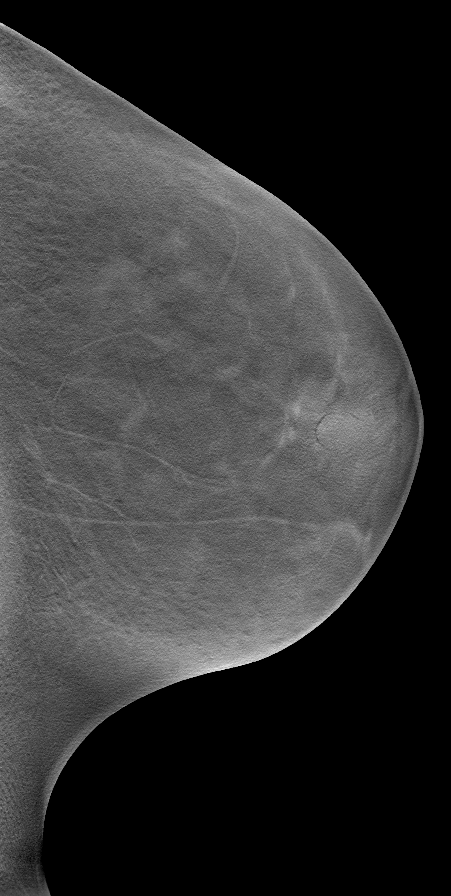

[L CC (2 of 2)]
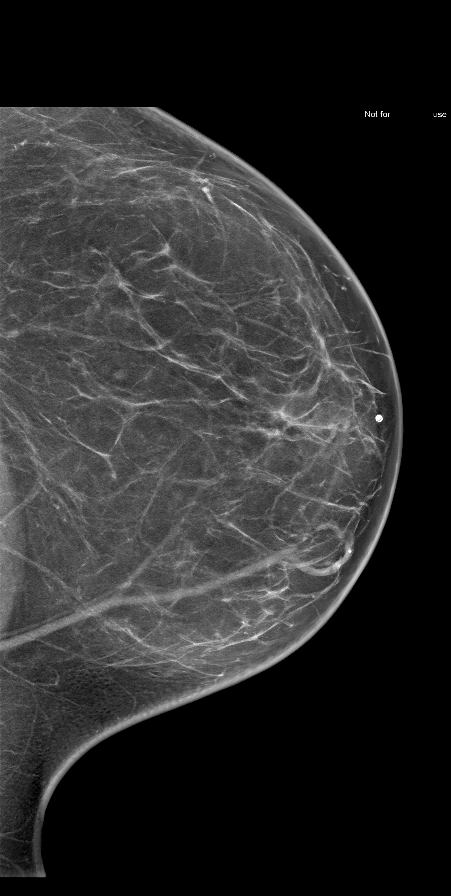

[L]
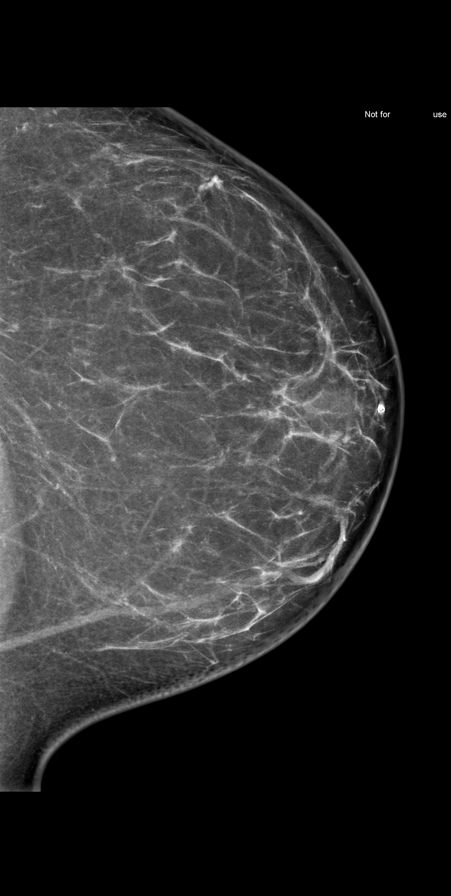

[R MLO (1 of 2)]
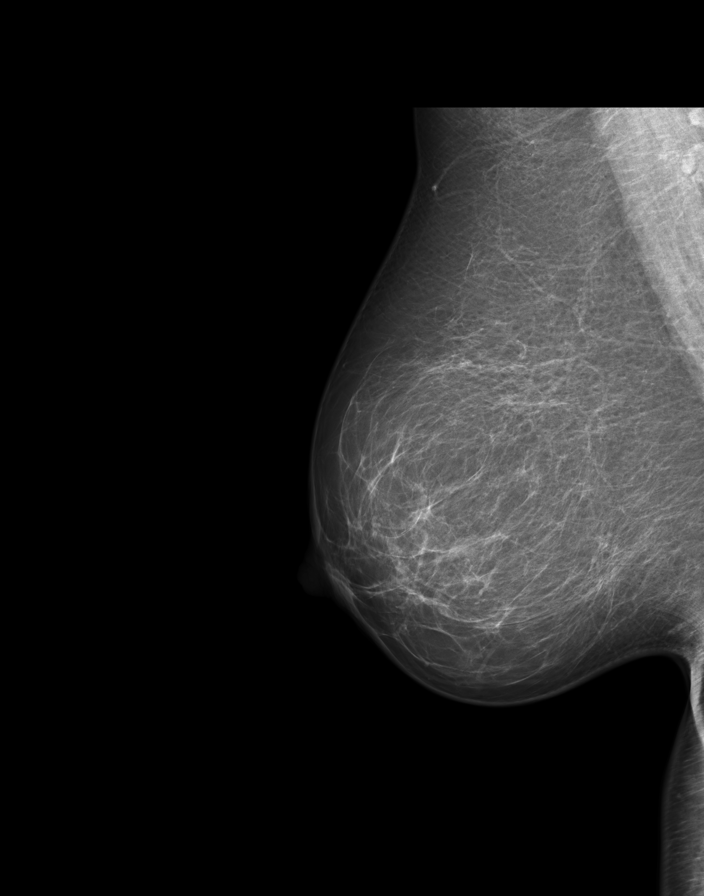

[R tomo (2 of 2)]
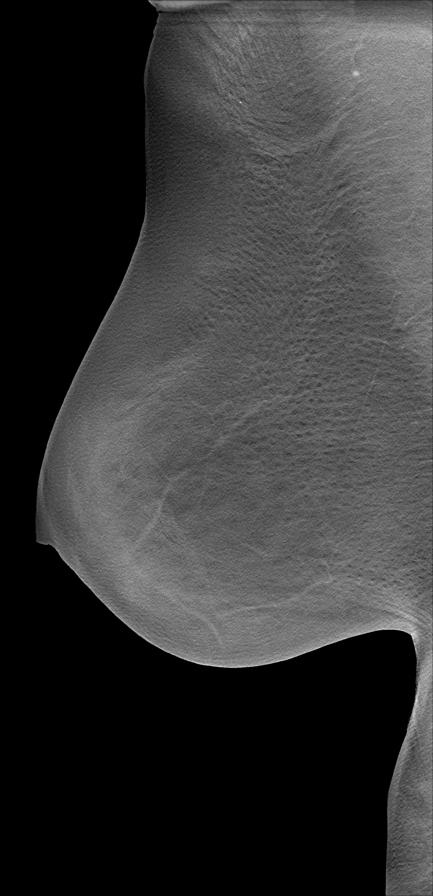

[R MLO (2 of 2)]
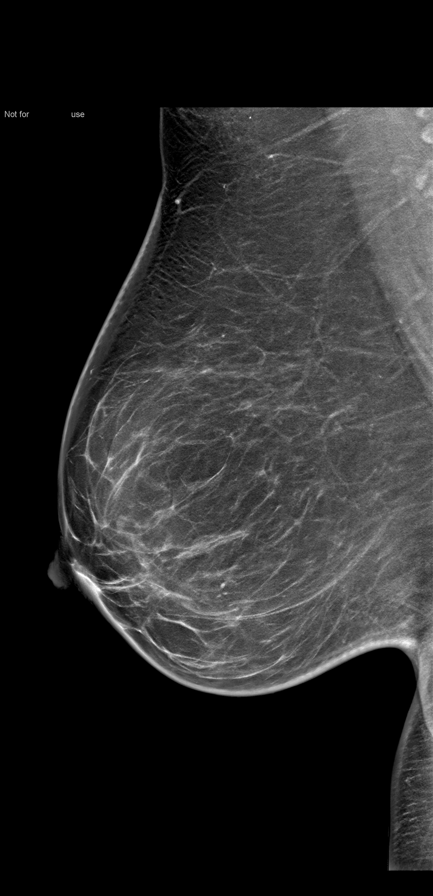

[R (2 of 2)]
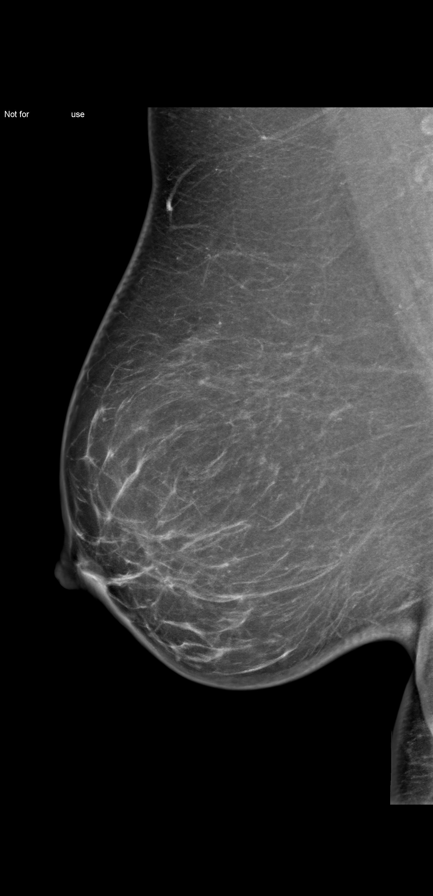

[12 of 15 positions shown; findings below may reference images not displayed]

DIAGNOSTIC STUDIES

EXAM
Screening mammogram.

INDICATION
screening
SCREENING. AB (3D) PRIORS: 5329.

TECHNIQUE
Tomographic and digitally acquired CC and MLO views of both breasts. CAD utilized for
interpretation.

COMPARISONS
March 21, 2016.

FINDINGS
Scattered fibroglandular density. There is no new mass, distortion, skin thickening, nipple
retraction, or suspicious calcification.

IMPRESSION
Negative, BI-RADS category 1.

RECOMMENDATION
Annual screening mammography. A notification letter will be sent to the patient regarding findings
and recommendation.

Tech Notes:

## 2019-02-18 IMAGING — CR ABDOMEN
2 series · 2 of 2 positions shown · non-contrast
Comparison: none

[abd supine x-wise (1 of 2)]
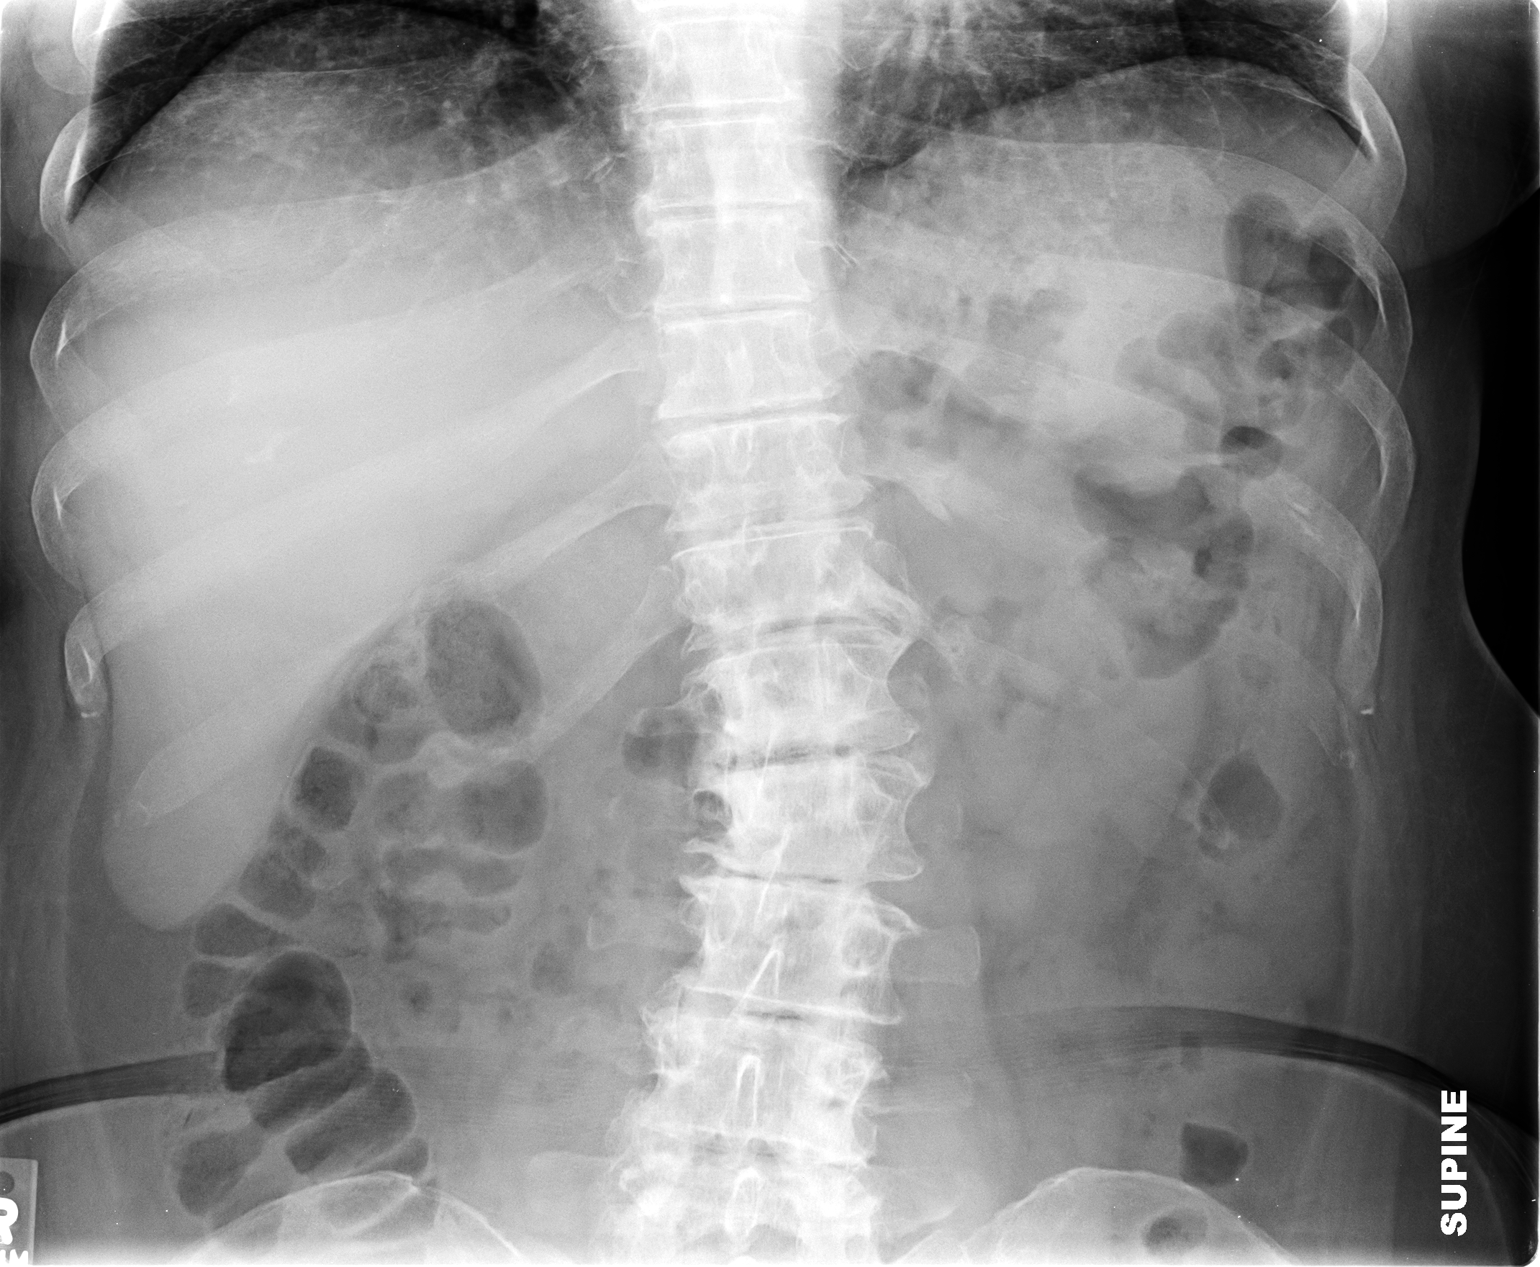

[abd supine x-wise (2 of 2)]
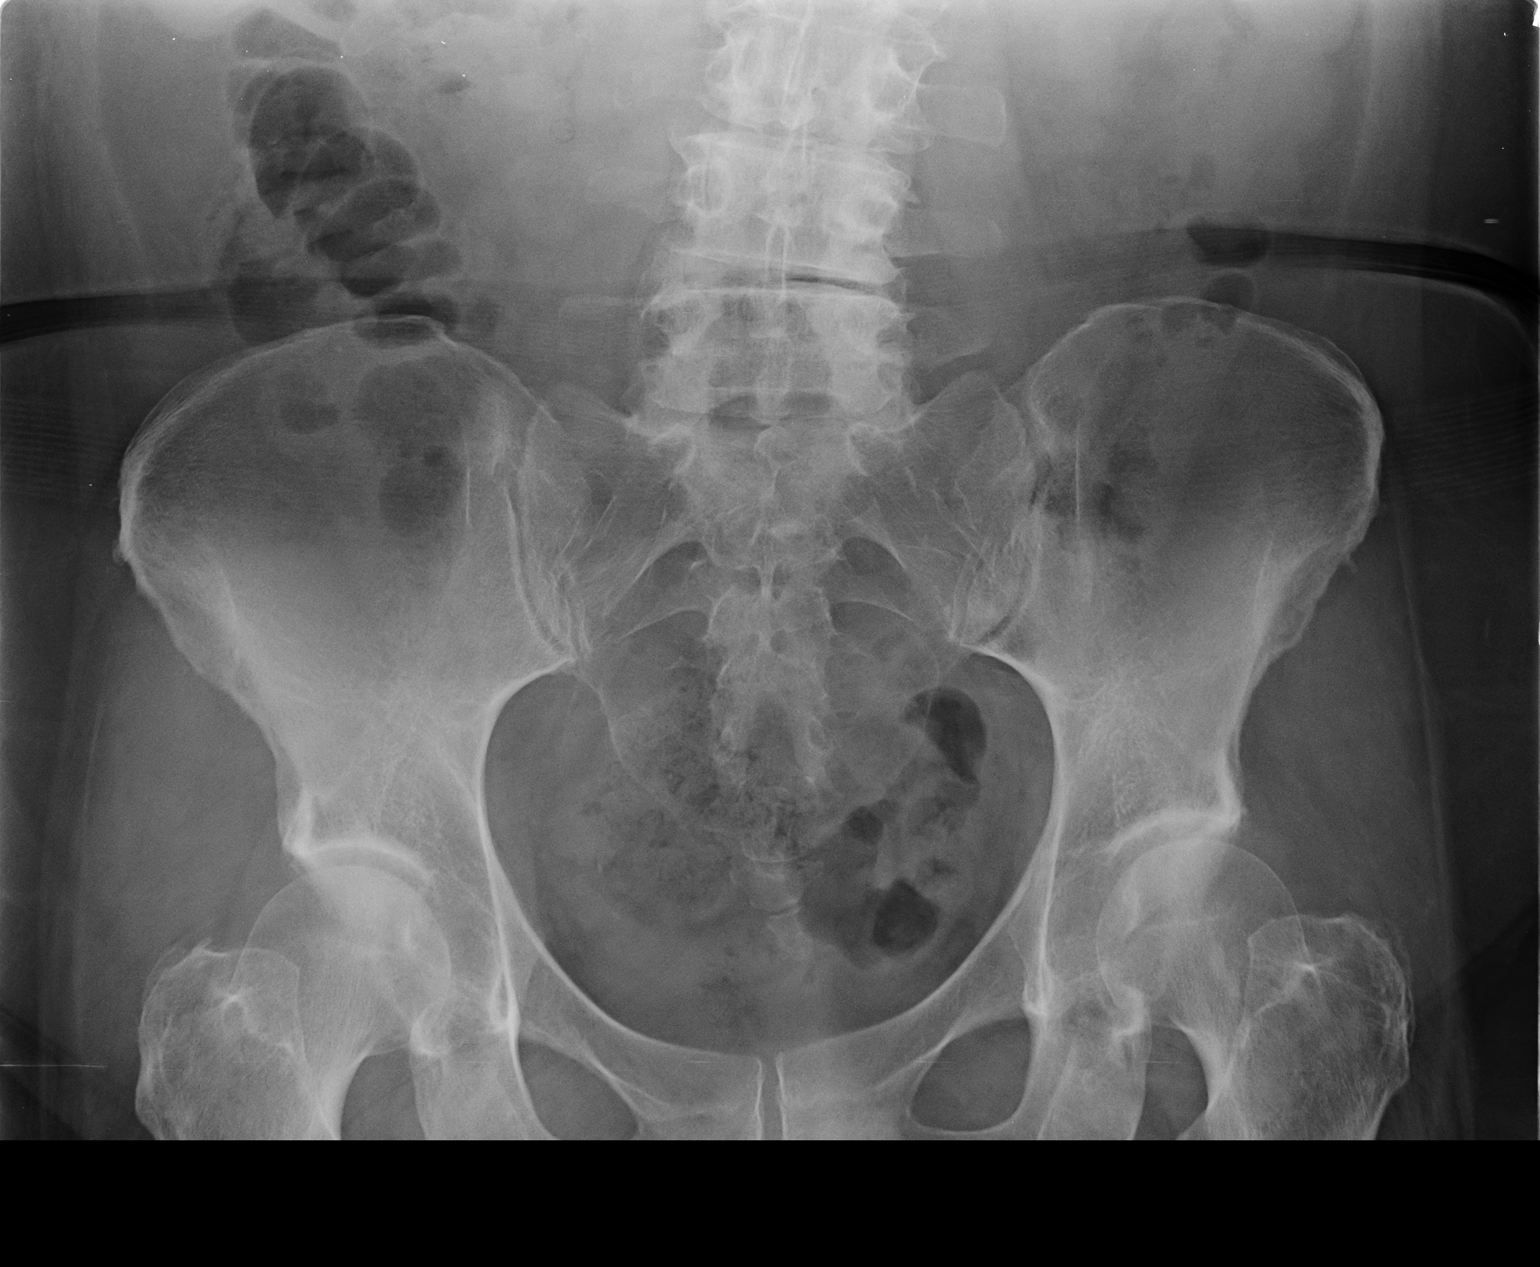

[2 of 2 positions shown; findings below may reference images not displayed]

DIAGNOSTIC STUDIES

EXAM

Single-view abdomen

INDICATION

Constipation, abdominal pain, GERD
CONSTIPATION X 6 WEEKS.  TAKEN LAXITIVE REGIMINE 4X WITH NO RELIEF.  AB

TECHNIQUE

Single AP view abdomen

COMPARISONS

Single abdomen February 10, 2019.

FINDINGS

There is a nonspecific, nonobstructive bowel gas pattern.  There is a  mild  amount of intracolonic
fecal distention.  There is no evidence of free air.  There is no pathologic calcification or acute
osseous abnormality. Degenerative changes of the lumbar spine are appreciated with minimal levo
scoliotic deformity of the AP alignment.

IMPRESSION

1.  Nonspecific, nonobstructive bowel gas pattern. Overall there is improved intra colonic stool
within the colon with a minimal residual amount stool in the rectum.

Tech Notes:

CONSTIPATION X 6 WEEKS.  TAKEN LAXITIVE REGIMINE 4X WITH NO RELIEF.  AB

## 2019-04-20 IMAGING — MR Hips^ROUTINE
6 of 8 series · 36 of 48 positions shown · non-contrast
Comparison: none

[Series 2: STIR · coronal · 5.0mm · 1.19mm/px · 7 of 30 slices shown]
[im 1/30]
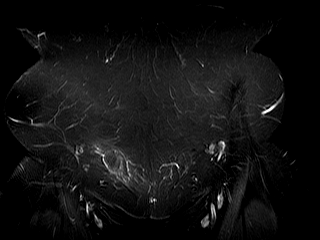
[im 5/30]
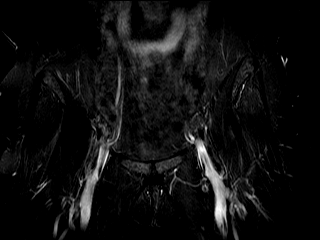
[im 9/30]
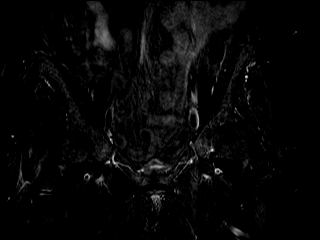
[im 13/30]
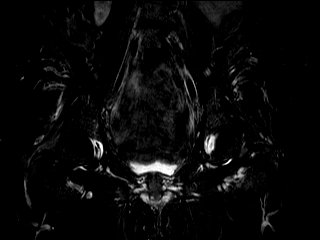
[im 17/30]
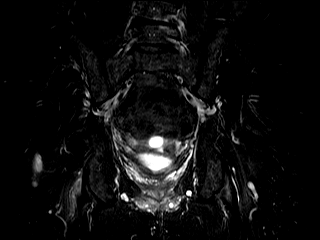
[im 21/30]
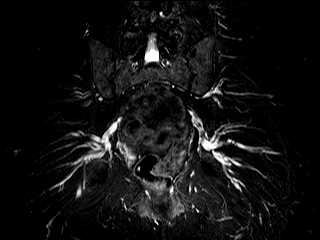
[im 25/30]
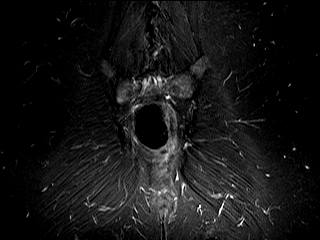

[Series 3: T2 fat-sat · axial · 5.0mm · 0.68mm/px · z∈[-100,+69]mm · 7 of 27 slices shown (1 of 3)]
[im 1/27]
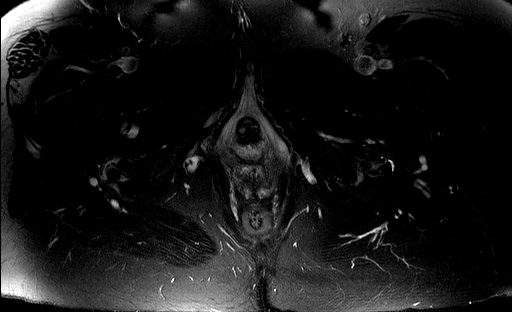
[im 5/27]
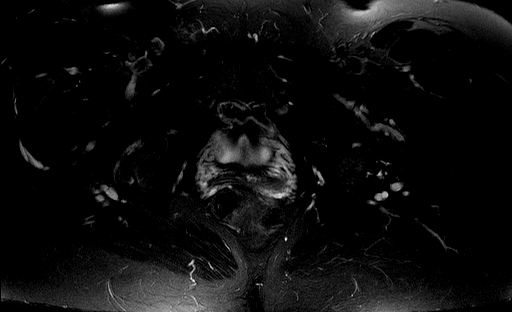
[im 9/27]
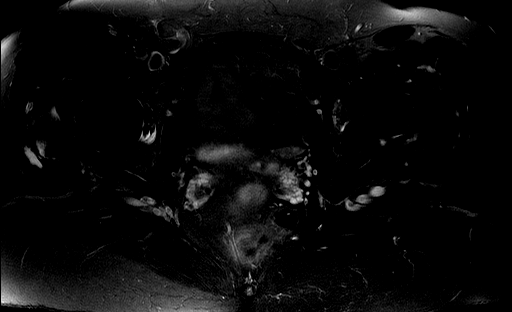
[im 14/27]
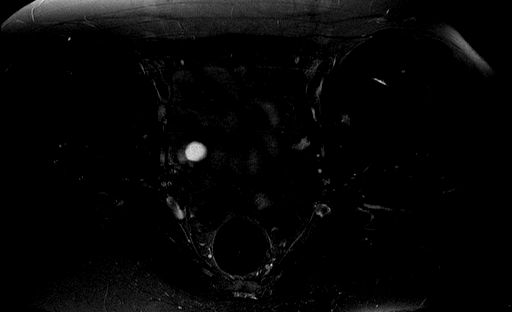
[im 18/27]
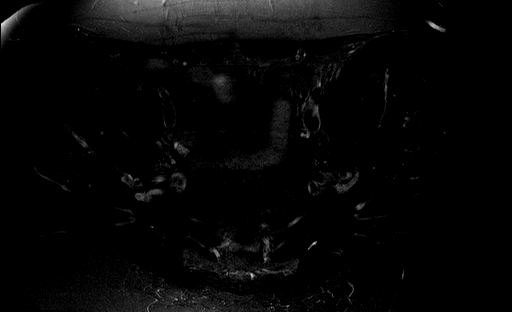
[im 22/27]
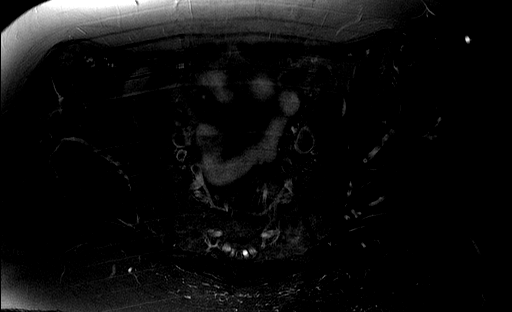
[im 27/27]
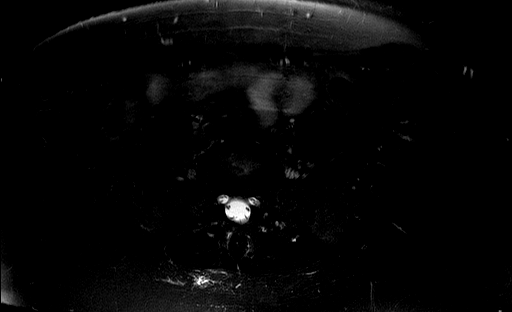

[Series 4: T1 · coronal · 4.0mm · 0.74mm/px · 5 of 20 slices shown]
[im 1/20]
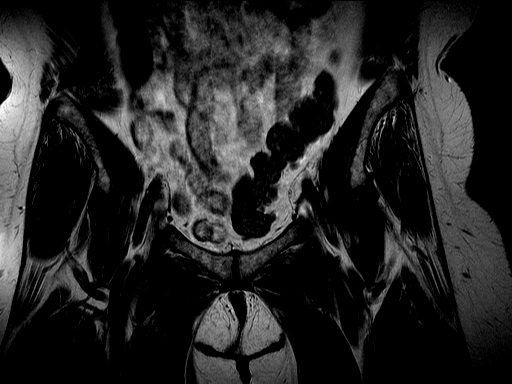
[im 5/20]
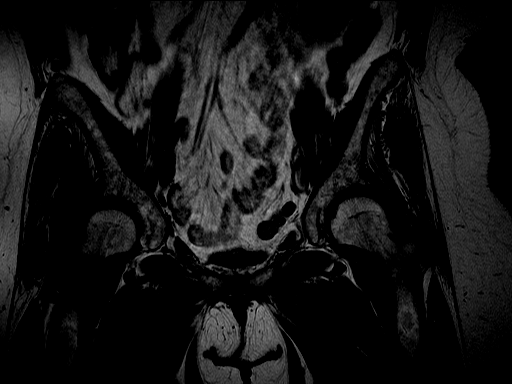
[im 10/20]
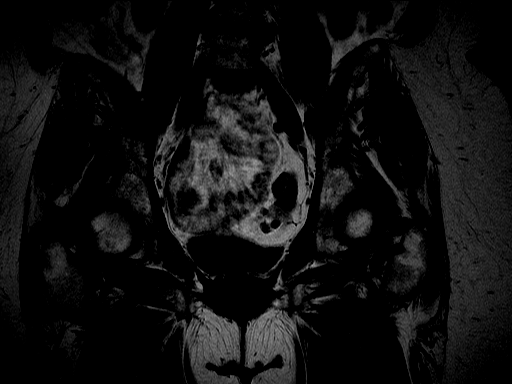
[im 15/20]
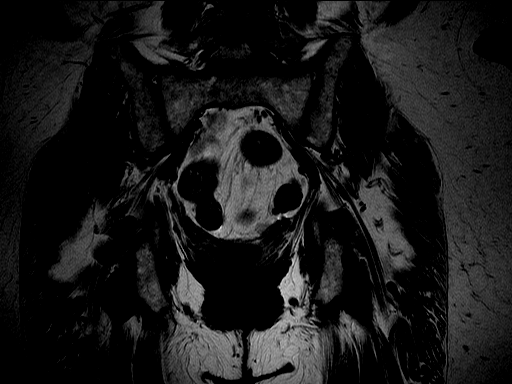
[im 20/20]
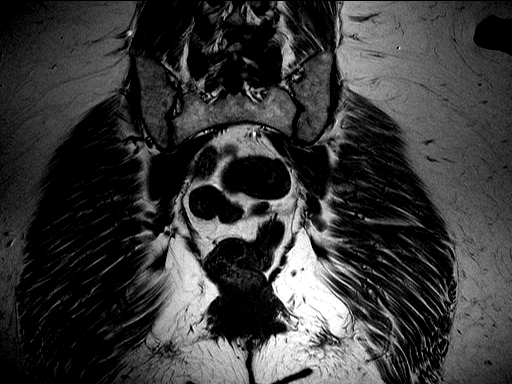

[Series 5: T1 fat-sat · axial · 6.0mm · 0.74mm/px · z∈[-129,+81]mm · 7 of 29 slices shown]
[im 1/29]
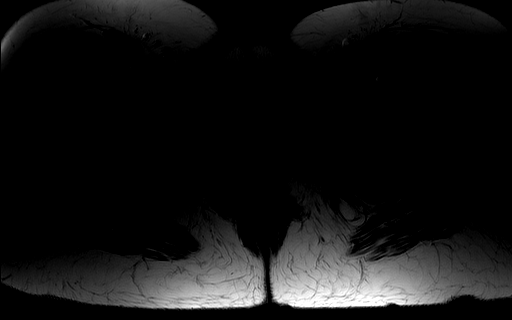
[im 5/29]
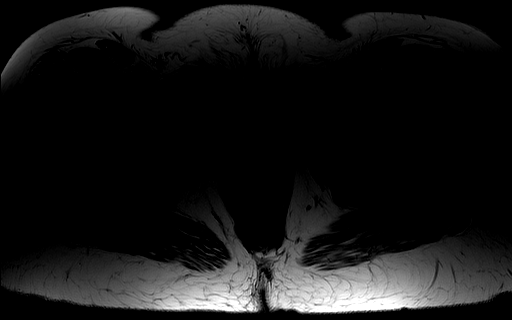
[im 10/29]
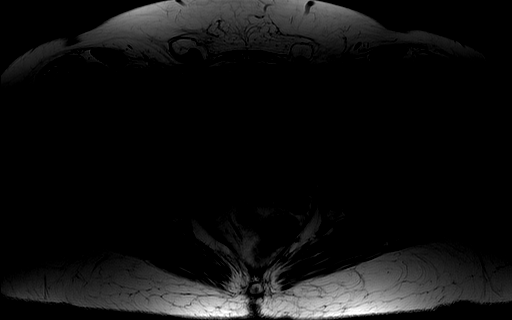
[im 15/29]
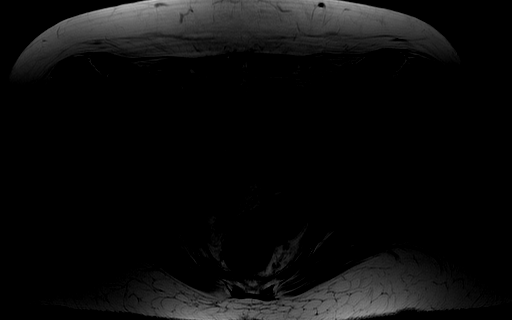
[im 19/29]
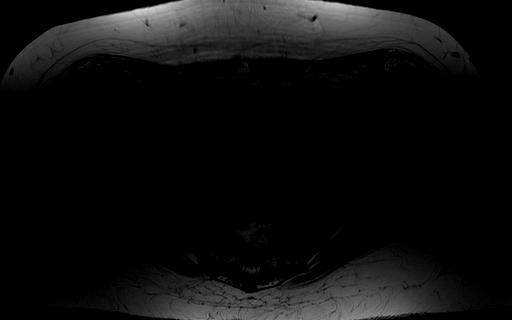
[im 24/29]
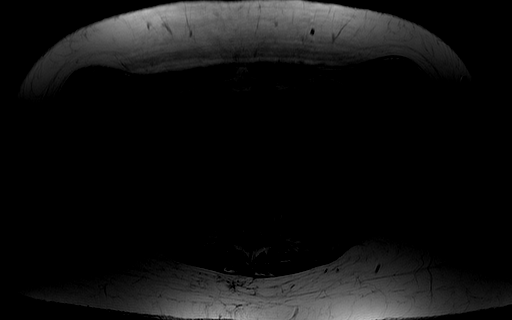
[im 29/29]
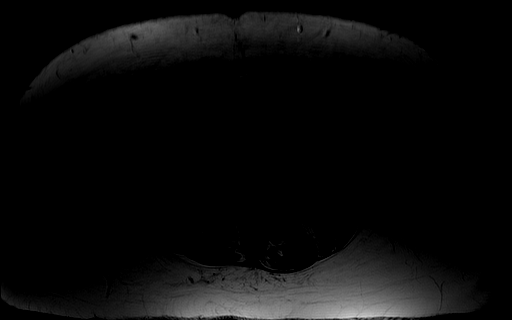

[Series 6: T2 fat-sat · sagittal · 3.5mm · 0.57mm/px · 5 of 21 slices shown (2 of 3)]
[im 1/21]
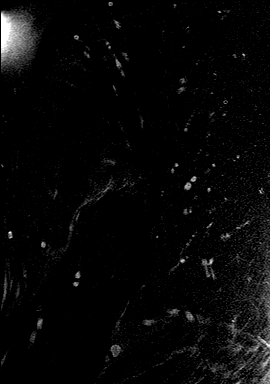
[im 6/21]
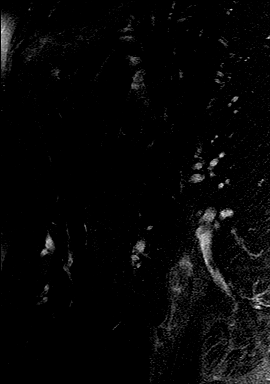
[im 11/21]
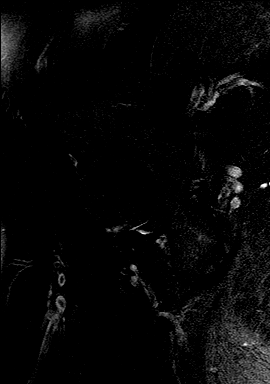
[im 16/21]
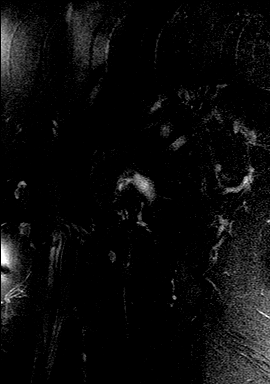
[im 21/21]
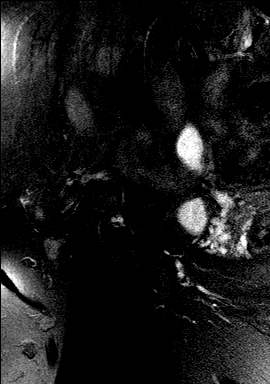

[Series 7: T2 fat-sat · oblique · 3.5mm · 0.57mm/px · 5 of 21 slices shown (3 of 3)]
[im 1/21]
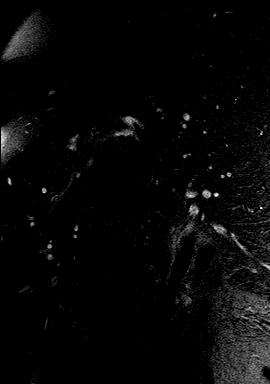
[im 6/21]
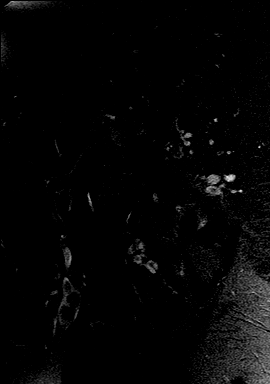
[im 11/21]
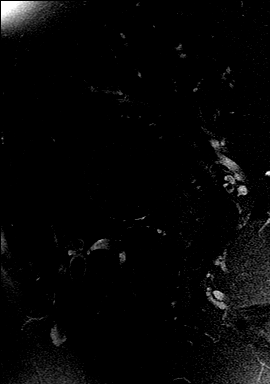
[im 16/21]
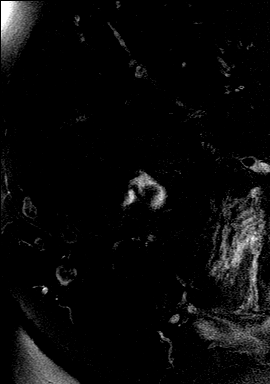
[im 21/21]
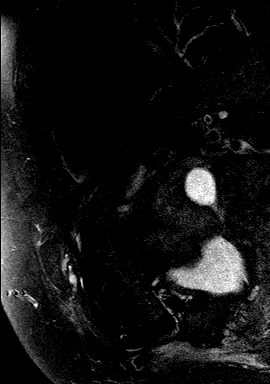

[36 of 48 positions shown; findings below may reference images not displayed]

Ordering:    TIGER, MOFIDUL

DIAGNOSTIC STUDIES

EXAM

MRI of the right hip without contrast.

INDICATION

Lumbar radiculopathy
LOW BACK PAIN WITH NUMBNESS AND TINGLING DOWN LEFT LEG.  RT HIP PAIN.  WEAKNESS TO RT HIP.  RG

TECHNIQUE

Sagittal, axial, and images were obtained with variable T1 and T2 weighting.

COMPARISONS

None available

FINDINGS

There are normal marrow signal properties of the right hip and visualized pelvis. There is no
evidence for avascular necrosis. Increased signal is noted at the insertion of the common flexor
tendon on the greater trochanter with a small amount of adjacent fluid suggesting bursitis and/or
associated tendinitis. There is mild signal at the origin of the hamstring muscles bilaterally which
may indicate underlying tendinosis also.

No abnormal masses or fluid collections are seen. The patient is status post hysterectomy.
Physiologic cysts are noted on the ovaries and small fat containing inguinal hernias are evident.

IMPRESSION

T2 signal and fluid at the insertion of the common flexor tendon on the greater trochanter
consistent with trochanteric bursitis/tendinitis.

Mild T2 signal at the origin of the hamstring muscles bilaterally.

Physiologic cysts on the ovaries. There is no evidence for avascular necrosis.

Tech Notes:

LOW BACK PAIN WITH NUMBNESS AND TINGLING DOWN LEFT LEG.  RT HIP PAIN.  WEAKNESS TO RT HIP.  RG

## 2019-04-20 IMAGING — MR L-spine^LUMBAR BLOCK
5 series · 33 of 48 positions shown · non-contrast
Comparison: none

[Series 2: T2 · sagittal · 4.0mm · 0.62mm/px · 7 of 13 slices shown (1 of 2)]
[im 1/13]
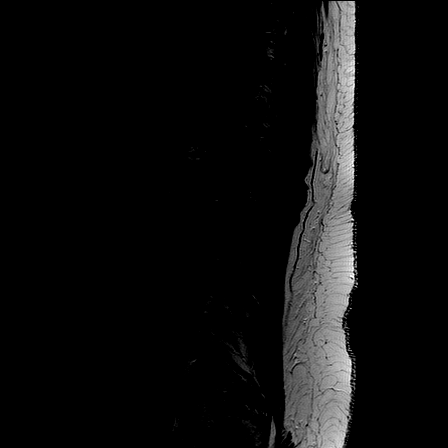
[im 3/13]
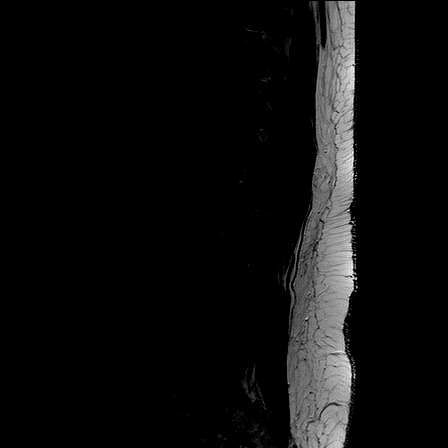
[im 5/13]
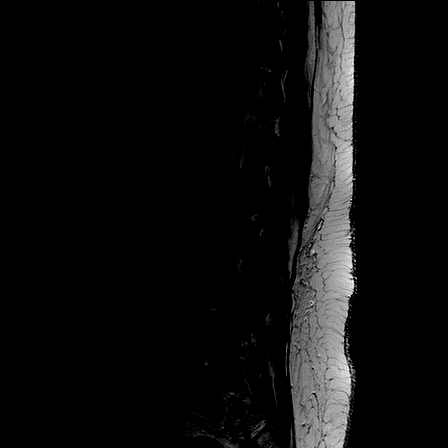
[im 7/13]
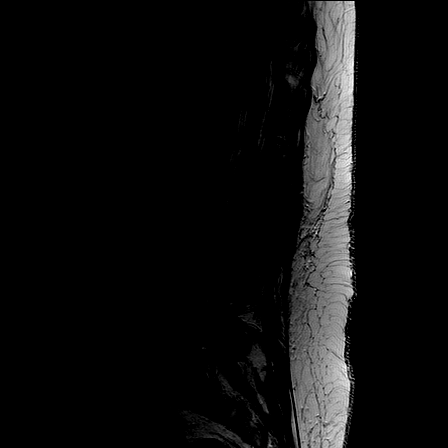
[im 9/13]
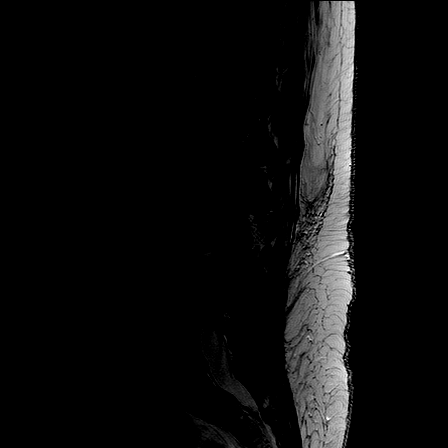
[im 11/13]
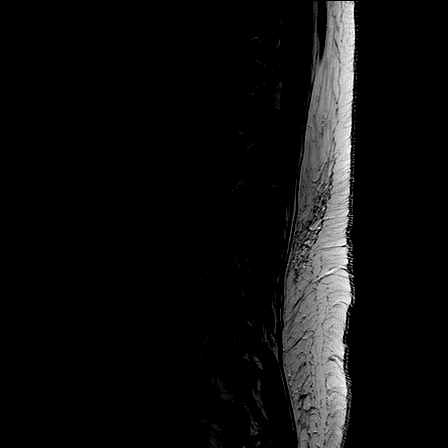
[im 13/13]
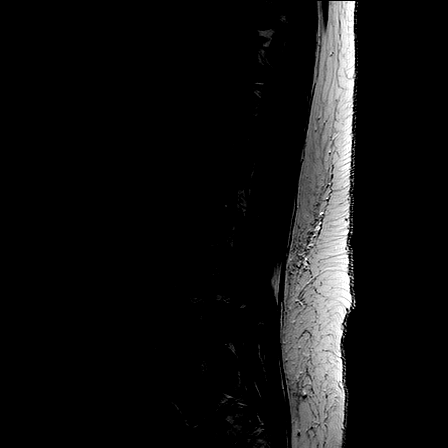

[Series 3: T1 · sagittal · 4.0mm · 0.73mm/px · 7 of 13 slices shown (1 of 2)]
[im 1/13]
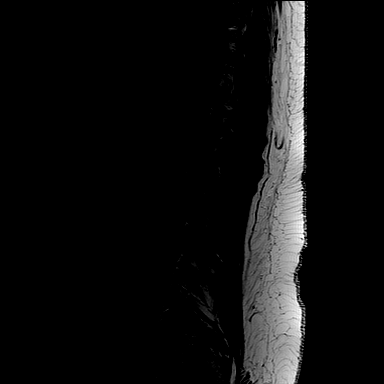
[im 3/13]
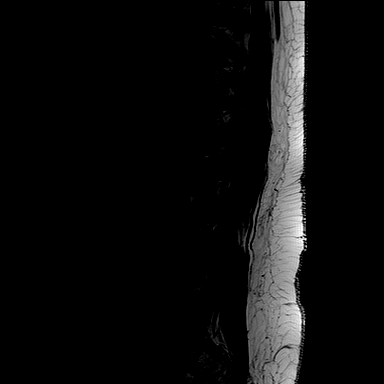
[im 5/13]
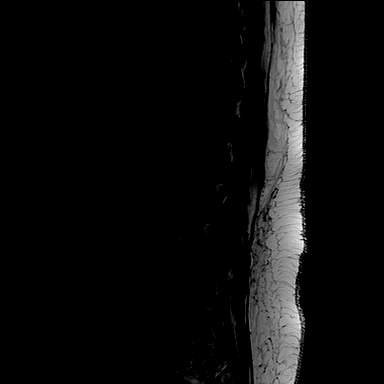
[im 7/13]
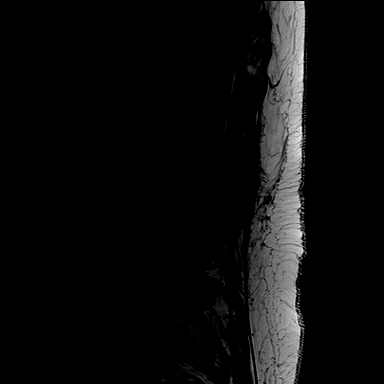
[im 9/13]
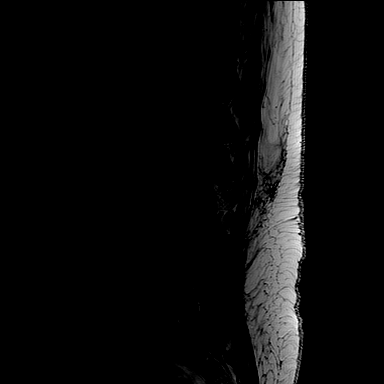
[im 11/13]
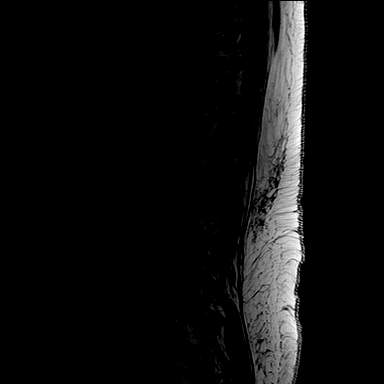
[im 13/13]
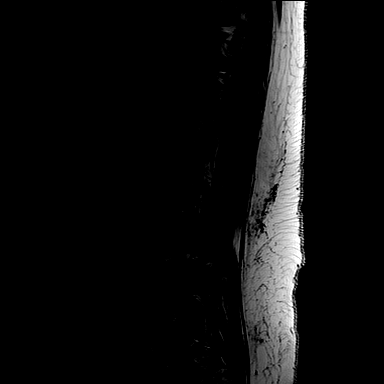

[Series 4: STIR · sagittal · 4.0mm · 0.55mm/px · 3 of 13 slices shown]
[im 1/13]
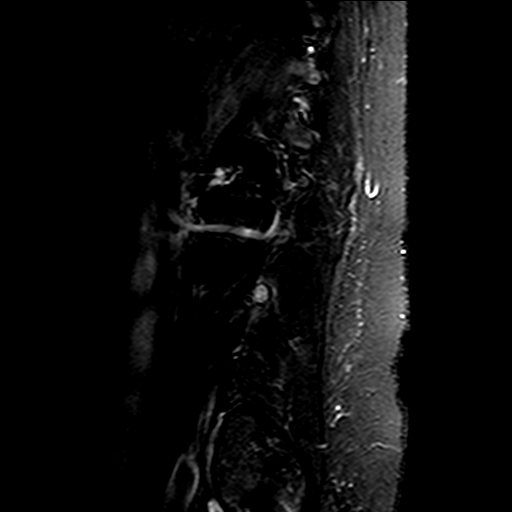
[im 3/13]
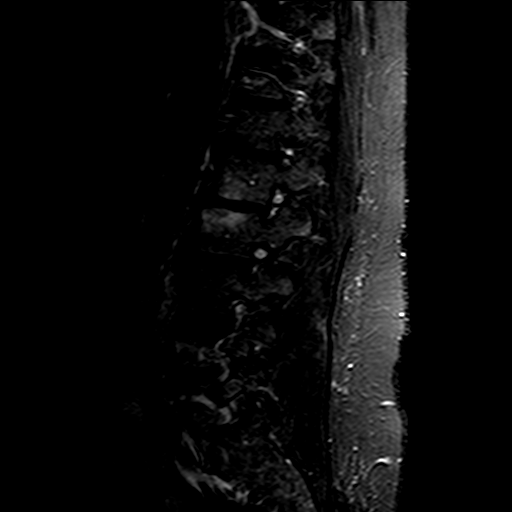
[im 5/13]
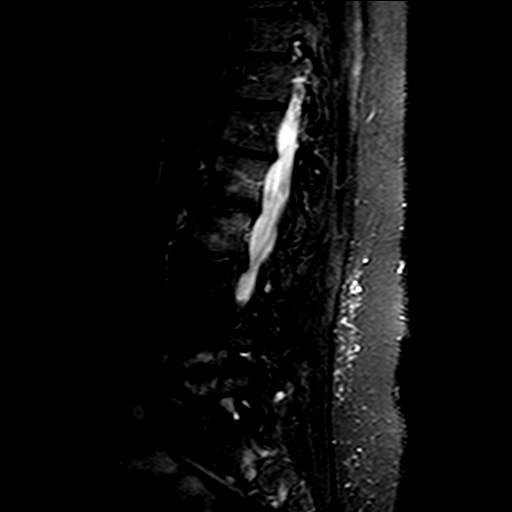

[Series 5: T2 · axial · 4.5mm · 0.49mm/px · z∈[-200,-7]mm · 8 of 28 slices shown (2 of 2)]
[im 1/28]
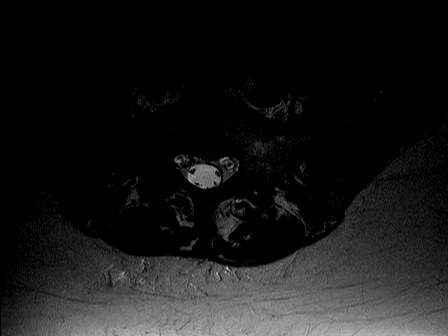
[im 5/28]
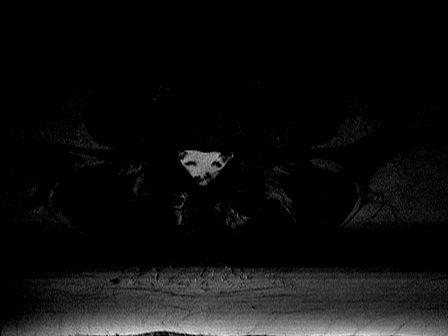
[im 9/28]
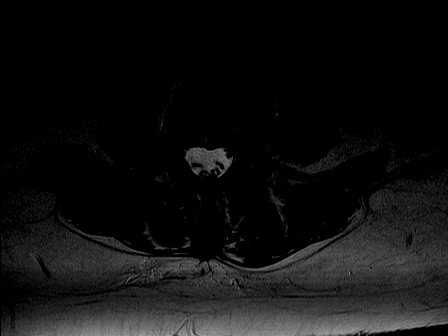
[im 13/28]
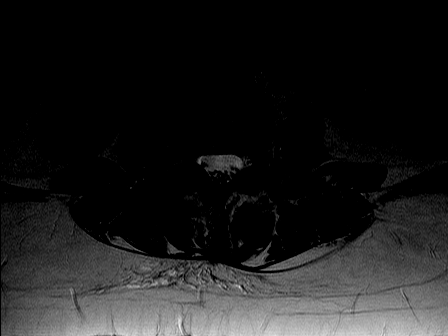
[im 15/28]
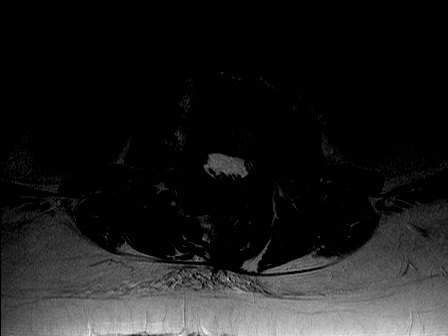
[im 19/28]
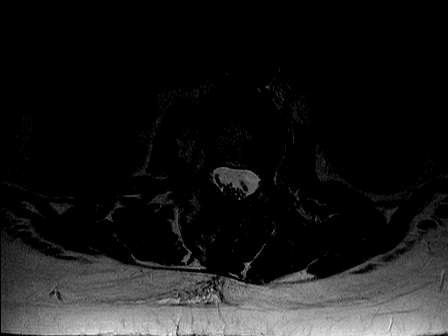
[im 23/28]
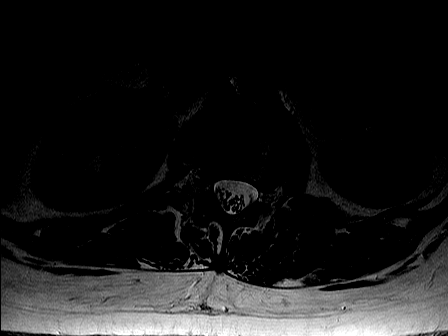
[im 28/28]
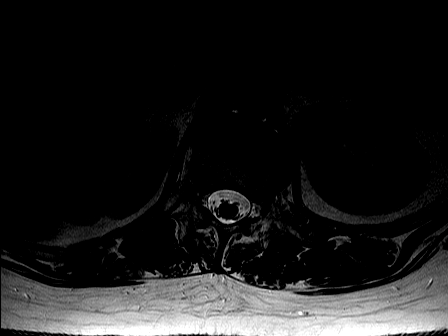

[Series 6: T1 · axial · 4.5mm · 0.86mm/px · z∈[-200,-7]mm · 8 of 29 slices shown (2 of 2)]
[im 1/29]
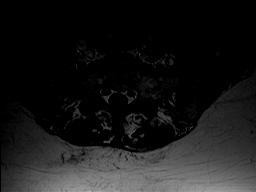
[im 5/29]
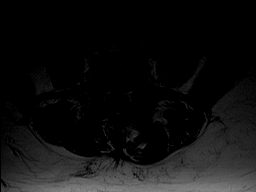
[im 9/29]
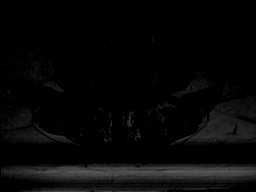
[im 13/29]
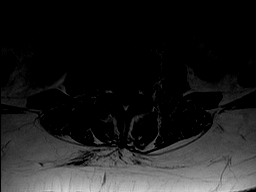
[im 16/29]
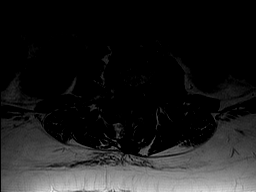
[im 20/29]
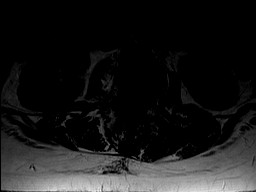
[im 24/29]
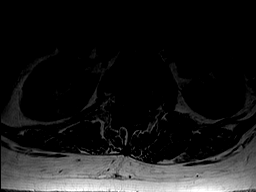
[im 29/29]
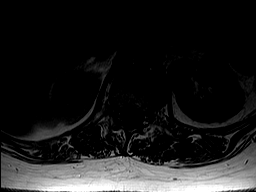

[33 of 48 positions shown; findings below may reference images not displayed]

Ordering:    KELLAR, GIBRAN

DIAGNOSTIC STUDIES

EXAM

MRI lumbar spine without contrast.

INDICATION

Lumbar radiculopathy
LOW BACK PAIN WITH NUMBNESS TO LEFT LEG AND FOOT.  WORSENING OVER THE LAST YEAR.

TECHNIQUE

Sagittal and axial images were obtained with variable T1 and T2 weighting.

COMPARISONS

May 19, 2015.

FINDINGS

There are no plain films. Patient is assumed to have 5 lumbar type vertebral bodies.

Conus medullaris is normal in signal intensity and location there is mild disc bulging at T10-T11,
T11-T12, and T12-L1 without significant central canal or neural foraminal stenosis. There is mild
dextroscoliosis of the lumbar spine resulting and preferential disc space narrowing on the right at
L1-2 and L2-3.

L1-2: Advanced degenerative changes of the adjacent vertebral endplates is evident with associated
marrow edema. There is mild bulging of the disc and facet hypertrophy resulting in moderate right
neural foraminal stenosis.

L2-3: There is disc bulging and spurring of the vertebral endplates. There is bilateral facet
hypertrophy resulting in moderate right neural foraminal stenosis.

L3-4: Loss of height and slight disc bulging is seen. There is bilateral facet hypertrophy
resulting in mild-to-moderate bilateral neural foraminal stenosis.

L4-5: Disc bulging and prominent facet hypertrophy is noted this results in severe left neural
foraminal stenosis and minimal right neural foraminal stenosis.

L5-S1: Minimal disc bulging is noted with facet hypertrophy resulting in mild-to-moderate left
neural foraminal stenosis.

IMPRESSION

Mild levoscoliosis lumbar spine resulting in preferential disc space narrowing on the right at L1-
2 and L2-3.

Moderate right neural foraminal stenosis at L1-2 and L2-3.

Severe left neural foraminal stenosis at L4-5 and minimal right neural foraminal stenosis at L4-5.

Mild to moderate bilateral neural foraminal stenosis at L3-4. There is mild-to-moderate left neural
foraminal stenosis at L5-S1.

Tech Notes:

LOW BACK PAIN WITH NUMBNESS TO LEFT LEG AND FOOT.  WORSENING OVER THE LAST YEAR.

## 2019-05-06 ENCOUNTER — Encounter: Admit: 2019-05-06 | Discharge: 2019-05-06 | Payer: BC Managed Care – PPO

## 2019-05-06 DIAGNOSIS — M545 Low back pain: Secondary | ICD-10-CM

## 2019-05-06 NOTE — Telephone Encounter
Pre Visit Planning- New Patient    Records received: No    Orders have been NA    Patient active in MyChart. No appointment reminder sent. .     Patient notified of upcoming appointment.    Updated chart: Medication, History and Allergies    MRI requested via cloud

## 2019-05-07 ENCOUNTER — Encounter: Admit: 2019-05-07 | Discharge: 2019-05-07 | Payer: BC Managed Care – PPO

## 2019-05-07 ENCOUNTER — Ambulatory Visit: Admit: 2019-05-07 | Discharge: 2019-05-07 | Payer: BC Managed Care – PPO

## 2019-05-07 DIAGNOSIS — M545 Low back pain: Secondary | ICD-10-CM

## 2019-05-07 DIAGNOSIS — F329 Major depressive disorder, single episode, unspecified: Secondary | ICD-10-CM

## 2019-05-07 DIAGNOSIS — I10 Essential (primary) hypertension: Secondary | ICD-10-CM

## 2019-05-07 DIAGNOSIS — M4180 Other forms of scoliosis, site unspecified: Secondary | ICD-10-CM

## 2019-05-07 DIAGNOSIS — M5136 Other intervertebral disc degeneration, lumbar region: Secondary | ICD-10-CM

## 2019-05-07 DIAGNOSIS — M4316 Spondylolisthesis, lumbar region: Secondary | ICD-10-CM

## 2019-05-07 DIAGNOSIS — M5416 Radiculopathy, lumbar region: Secondary | ICD-10-CM

## 2019-05-07 DIAGNOSIS — M48061 Spinal stenosis, lumbar region without neurogenic claudication: Secondary | ICD-10-CM

## 2019-05-07 NOTE — Progress Notes
[facility] Pugh, MD [facility]  Spinal Surgery Consultation      CHIEF COMPLAINT     Chief Complaint   Patient presents with   ? Lower Back - Pain   ? Left Leg - Pain   ? Right Leg - Pain       HISTORY OF PRESENT ILLNESS      Hannah Stewart is a 53 y.o. female.  She presents for evaluation of low back pain, left leg pain, left leg burning, and hand numbness.  Been going on for over a year.  Most of her pain is in her left leg.  Been told she has spinal stenosis.  Not work related.  Pain is constant, aching, nagging, shooting, and burning.  Rest makes it better.  Sitting and standing make it worse.  She can sit or stand for about an hour and she can walk for about 30 minutes before she has pain down her leg.  Goes down to her toes at times.  Starts in her hip and goes mainly into her ankle.  She does report loss of bladder function three times a week for about six months.  She states this has steadily worsened.  She was started on medications for this, which have not helped.  She does have night pain that wakes her up at night.  Low back pain is rated 6/10.  On average is an 8/10.  She has had imaging studies at outside institutions.  She has tried anti-inflammatories, neuropathic medications, and muscle relaxers.  Only have given her some relief.  No previous spine surgery.  She has had multiple injections over the past four years at [facility].  She reports the majority of her injections have been done at L3-4.  She is a current pack a day smoker.  She has tried quitting in the past.  She has stopped for three months.  No history of blood thinners.      NSAIDS: celecoxib (Celebrex), diclofenac (Voltaren)  PT: Yes  Pain medications: None  Chiropractic: No  Activity modification: Yes  Injections: Yes     Previous Spine Surgery: No     PAST MEDICAL HISTORY     Medical History:   Diagnosis Date   ? Depression    ? Essential hypertension, benign        PAST SURGICAL HISTORY     Surgical History: Procedure Laterality Date   ? TONSILLECTOMY  1975   ? ANKLE SURGERY  1989   ? TUBAL LIGATION  1999   ? HYSTERECTOMY  2003   ? CARPAL TUNNEL RELEASE Bilateral 2013       FAMILY HISTORY   family history is not on file.    SOCIAL HISTORY     Social History     Socioeconomic History   ? Marital status: Married     Spouse name: Not on file   ? Number of children: Not on file   ? Years of education: Not on file   ? Highest education level: Not on file   Occupational History   ? Not on file   Tobacco Use   ? Smoking status: Current Every Day Smoker     Packs/day: 1.00   ? Smokeless tobacco: Never Used   Substance and Sexual Activity   ? Alcohol use: Not Currently   ? Drug use: Not Currently   ? Sexual activity: Not on file   Other Topics Concern   ? Not on file  Social History Narrative   ? Not on file       ALLERGIES   No Known Allergies    MEDICATIONS     Current Outpatient Medications:   ?  diclofenac potassium (CATAFLAM) 50 mg tablet, Take 50 mg by mouth three times daily., Disp: , Rfl:   ?  duloxetine DR (CYMBALTA) 60 mg capsule, Take 60 mg by mouth daily., Disp: , Rfl:   ?  losartan-hydrochlorothiazide (HYZAAR) 100-25 mg tablet, Take 1 tablet by mouth daily., Disp: , Rfl:   ?  omeprazole DR (PRILOSEC) 40 mg capsule, Take 40 mg by mouth daily., Disp: , Rfl:   ?  potassium chloride SR (K-DUR) 20 mEq tablet, Take 20 mEq by mouth daily., Disp: , Rfl:   ?  temazepam (RESTORIL) 7.5 mg capsule, as Needed., Disp: , Rfl:     REVIEW OF SYSTEMS   Review of Systems   Musculoskeletal: Positive for back pain.   All other systems reviewed and are negative.    A 10-point ROS was otherwise negative.  PHYSICAL EXAM   Blood pressure 126/73, pulse 104, resp. rate 18, height 171.5 cm (67.5), weight 103.4 kg (228 lb), SpO2 99 %.  Body mass index is 35.18 kg/m?Marland Kitchen    Oswestry Total Score:: 36  Pain Score: Six    Constitutional: Alert, NAD  Psychiatric: Mood and affect appropriate  Eyes: EOMI  Respiratory: Unlabored respirations Cardiovascular: Palpable radial and pedal pulses distally.  Skin: No rashes or lesions  Musculoskeletal:  Spine Exam:    Gait: Ambulates with a normal gait. Able to heel and toe walk without difficulty.   Stance: Balanced in the coronal and sagittal planes.    BACK:   No scars, rash or lesions.    PALPATION:  No pain in the midline or paraspinals. No pain at the SI joint or sciatic notch.    MOTOR:  Lower Ext. Iliopsoas Quads Hamstrings Gastroc Tib ant EHL   Right 5 5 5 5 5 5    Left 5 5 5 5 5 5      SENSATION:  Lower extremity: Sensation intact to light touch in L3-S1 distributions    REFLEXES:   Patellar Achilles   Right mute mute   Left mute mute     -Negative straight leg raise bilaterally  -Babinski's plantar bilaterally  -No clonus         RADIOGRAPHIC EVALUATION     AP lateral scoliosis films from today are available for review.  These show a likely degenerative thoracolumbar scoliosis from T12-L3 measuring approximately 34 degrees.  Asymmetric collapse to the patient's right side.  Concavity is right.  There is a fractional curve with asymmetric disc collapse at L4-5 on the left.  Balanced in the coronal and sagittal planes.    MRI from outside institution from April 20, 2019, is available for review.  Demonstrates multi-level degenerative disc disease.  There is mild left sided lateral recess stenosis L2-3.  L3-4 no significant central stenosis and really no significant lateral recess stenosis.  L4-5 there is moderate to severe lateral recess stenosis on the left.  At L5-S1 no significant stenosis.  There is also severe left sided foraminal stenosis at L4-5 and there is some mild L5-S1 foraminal stenosis on the left.  On the right side mild foraminal stenosis at L4-5.  Also at L3-4 and L2-3.       ASSESSMENT / PLAN     Hannah Stewart is a 53 y.o. female with:  1. Degenerative scoliosis in adult patient  [place] AMB SPINE INJECT SNRB/TFESI LUMBAR/SACRAL 2. DDD (degenerative disc disease), lumbar  [place] AMB SPINE INJECT SNRB/TFESI LUMBAR/SACRAL   3. Foraminal stenosis of lumbar region  [place] AMB SPINE INJECT SNRB/TFESI LUMBAR/SACRAL   4. Spinal stenosis of lumbar region with radiculopathy  [place] AMB SPINE INJECT SNRB/TFESI LUMBAR/SACRAL   5. Spondylolisthesis of lumbar region  [place] AMB SPINE INJECT SNRB/TFESI LUMBAR/SACRAL   6. Lumbar radicular pain  [place] AMB SPINE INJECT SNRB/TFESI LUMBAR/SACRAL       I reviewed the images, as well as the pathology with the patient and explained how it correlates with their clinical symptoms.  I think her symptoms could potentially be made better with surgery.  First and foremost I would like to send her for a selective nerve root block at L4 on the left-hand side, which I think is the most symptomatic level for her.  I think if she responded well to this, I think she could consider surgery.  I did discuss with her that prior to scheduling surgery, she would need to be nicotine free for 30 days.  I discussed the increased risks associated with continuing to smoke should she undergo a surgical procedure such as infection, wound healing problems, and nonunion.  If she does not respond well to this, we would need to look further at other levels that could be contributing to her symptoms.  We will plan to follow up with her via telephone or telehealth after her injection to review how she responded to this.  If she responds well to the injection, we can make a surgical plan and get something scheduled once she has been nicotine free 30 days.          In the presence of Marcelline Deist, MD , I have taken down these notes, Mamie Laurel, Scribe. May 07, 2019 1:54 PM      Dwyane Luo. Lisette Grinder, MD, MPH  Spinal Surgery  [place]. [facility] Pugh, [facility]  Nurse: Laverle Patter, BSN, RN, CNOR   [phone]  -  [email or web address]

## 2019-05-07 NOTE — Patient Instructions
It was a pleasure seeing you in clinic today.  Please don't hesitate to call if you have any questions.      Sakinah Rosamond BSN, RN, CNOR  Clinical Nurse Coordinator  Dr. Brandon Carlson  [facility]  [facility]  [address]  [place], [place] [address]  [email or web address]  Phone: [phone]  Fax:  [phone]  Scheduling [phone]

## 2019-05-27 ENCOUNTER — Encounter: Admit: 2019-05-27 | Discharge: 2019-05-27 | Payer: BC Managed Care – PPO

## 2019-05-27 DIAGNOSIS — R52 Pain, unspecified: Secondary | ICD-10-CM

## 2019-06-01 MED ORDER — IOHEXOL 300 MG IODINE/ML IV SOLN
2 mL | Freq: Once | 0 refills | Status: CP
Start: 2019-06-01 — End: ?

## 2019-06-01 MED ORDER — LIDOCAINE (PF) 10 MG/ML (1 %) IJ SOLN
2 mL | Freq: Once | INTRAMUSCULAR | 0 refills | Status: CP
Start: 2019-06-01 — End: ?

## 2019-06-01 MED ORDER — BUPIVACAINE (PF) 0.5 % (5 MG/ML) IJ SOLN
2 mL | Freq: Once | INTRAMUSCULAR | 0 refills | Status: CP
Start: 2019-06-01 — End: ?

## 2019-06-03 ENCOUNTER — Encounter: Admit: 2019-06-03 | Discharge: 2019-06-03 | Payer: BC Managed Care – PPO

## 2019-06-03 DIAGNOSIS — R69 Illness, unspecified: Secondary | ICD-10-CM

## 2019-06-05 ENCOUNTER — Encounter: Admit: 2019-06-05 | Discharge: 2019-06-05 | Payer: BC Managed Care – PPO

## 2019-06-05 ENCOUNTER — Ambulatory Visit: Admit: 2019-06-05 | Discharge: 2019-06-05 | Payer: BC Managed Care – PPO

## 2019-06-05 DIAGNOSIS — M4180 Other forms of scoliosis, site unspecified: Secondary | ICD-10-CM

## 2019-06-05 DIAGNOSIS — I10 Essential (primary) hypertension: Secondary | ICD-10-CM

## 2019-06-05 DIAGNOSIS — F329 Major depressive disorder, single episode, unspecified: Secondary | ICD-10-CM

## 2019-06-05 DIAGNOSIS — M48061 Spinal stenosis, lumbar region without neurogenic claudication: Secondary | ICD-10-CM

## 2019-06-05 DIAGNOSIS — M5416 Radiculopathy, lumbar region: Secondary | ICD-10-CM

## 2019-06-05 DIAGNOSIS — M545 Low back pain: Secondary | ICD-10-CM

## 2019-06-05 DIAGNOSIS — M5136 Other intervertebral disc degeneration, lumbar region: Secondary | ICD-10-CM

## 2019-06-05 DIAGNOSIS — M4316 Spondylolisthesis, lumbar region: Secondary | ICD-10-CM

## 2019-07-10 ENCOUNTER — Encounter: Admit: 2019-07-10 | Discharge: 2019-07-10 | Payer: BC Managed Care – PPO

## 2019-07-17 ENCOUNTER — Encounter: Admit: 2019-07-17 | Discharge: 2019-07-17 | Payer: BC Managed Care – PPO

## 2019-07-17 DIAGNOSIS — M5416 Radiculopathy, lumbar region: Secondary | ICD-10-CM

## 2019-07-19 ENCOUNTER — Encounter: Admit: 2019-07-19 | Discharge: 2019-07-19 | Payer: BC Managed Care – PPO

## 2019-07-21 ENCOUNTER — Encounter: Admit: 2019-07-21 | Discharge: 2019-07-21 | Payer: BC Managed Care – PPO

## 2019-07-21 ENCOUNTER — Ambulatory Visit: Admit: 2019-07-21 | Discharge: 2019-07-21 | Payer: BC Managed Care – PPO

## 2019-07-21 DIAGNOSIS — I10 Essential (primary) hypertension: Secondary | ICD-10-CM

## 2019-07-21 DIAGNOSIS — M4316 Spondylolisthesis, lumbar region: Secondary | ICD-10-CM

## 2019-07-21 DIAGNOSIS — M4180 Other forms of scoliosis, site unspecified: Secondary | ICD-10-CM

## 2019-07-21 DIAGNOSIS — F329 Major depressive disorder, single episode, unspecified: Secondary | ICD-10-CM

## 2019-07-21 DIAGNOSIS — M5416 Radiculopathy, lumbar region: Secondary | ICD-10-CM

## 2019-07-21 DIAGNOSIS — M48061 Spinal stenosis, lumbar region without neurogenic claudication: Secondary | ICD-10-CM

## 2019-07-21 DIAGNOSIS — M5136 Other intervertebral disc degeneration, lumbar region: Secondary | ICD-10-CM

## 2019-07-21 DIAGNOSIS — M545 Low back pain: Secondary | ICD-10-CM

## 2019-07-21 MED ORDER — IOHEXOL 240 MG IODINE/ML IV SOLN
2.5 mL | Freq: Once | EPIDURAL | 0 refills | Status: CP
Start: 2019-07-21 — End: ?
  Administered 2019-07-21: 17:00:00 2.5 mL via EPIDURAL

## 2019-07-21 MED ORDER — TRIAMCINOLONE ACETONIDE 40 MG/ML IJ SUSP
80 mg | Freq: Once | EPIDURAL | 0 refills | Status: CP
Start: 2019-07-21 — End: ?
  Administered 2019-07-21: 17:00:00 80 mg via EPIDURAL

## 2019-07-21 NOTE — Procedures
Attending Surgeon: Curly Rim, MD    Anesthesia: Local    Pre-Procedure Diagnosis:   1. Degenerative scoliosis in adult patient    2. DDD (degenerative disc disease), lumbar    3. Foraminal stenosis of lumbar region    4. Spinal stenosis of lumbar region with radiculopathy    5. Spondylolisthesis of lumbar region    6. Lumbar radicular pain    7. Lumbar pain        Post-Procedure Diagnosis:   1. Degenerative scoliosis in adult patient    2. DDD (degenerative disc disease), lumbar    3. Foraminal stenosis of lumbar region    4. Spinal stenosis of lumbar region with radiculopathy    5. Spondylolisthesis of lumbar region    6. Lumbar radicular pain    7. Lumbar pain        [place] [facility] INJECT SNRB/TFESI LUMBAR/SACRAL  Procedure: transforaminal epidural    Laterality: left    Location: lumbar - L4-5      Consent:   Consent obtained: verbal and written  Consent given by: patient  Risks discussed: allergic reaction, bleeding, bruising, infection, nerve damage, no change or worsening in pain and reaction to medication    Discussed with patient the purpose of the treatment/procedure, other ways of treating my condition, including no treatment/ procedure and the risks and benefits of the alternatives. Patient has decided to proceed with treatment/procedure.        Universal Protocol:  Relevant documents: relevant documents present and verified  Test results: test results available and properly labeled  Imaging studies: imaging studies available  Required items: required blood products, implants, devices, and special equipment available  Site marked: the operative site was marked  Patient identity confirmed: Patient identify confirmed verbally with patient.        Time out: Immediately prior to procedure a time out was called to verify the correct patient, procedure, equipment, support staff and site/side marked as required      Procedures Details:   Indications: pain and diagnostic evaluation   Prep: chlorhexidine Patient position: prone  Estimated Blood Loss: minimal  Specimens: none  Number of Levels: 1  Guidance: fluoroscopy  Contrast: Procedure confirmed with contrast under live fluoroscopy.  Needle and Epidural Catheter: quincke  Needle size: 25 G  Injection procedure: Incremental injection  Patient tolerance: Patient tolerated the procedure well with no immediate complications. Pressure was applied, and hemostasis was accomplished. Comments: DESCRIPTION OF PROCEDURE:  The procedure risks and benefits were explained to the patient.  Informed consent was obtained.  The patient was placed in the prone position on the fluoroscopy table with a pillow under the abdomen to help reduce lumbar lordosis.  Blood pressure cuff and oxygen saturation monitor were attached and  the patient was monitored throughout the entire procedure.  The L4 vertebral body was identified with the use of fluoroscopy in the AP view; the C-arm was obliqued to obtain a [employer].  The left L4 pedicle was visualized.  The skin was prepped using Chlorhexadine and draped in aseptic fashion.  The C-arm was rotated slightly obliquely towards the left side to visualize the area just below the foramen.  Skin and subcutaneous tissue were anesthetized using 3 mL of 1 percent lidocaine with a 27-gauge, 1-1/2 inch needle.  Next, a 3-1/2  inch 25-gauge spinal needle was slowly advanced to the 6 o'clock position to the left of the L4 pedicle just cephalad to the superior articular process.  The latter part  of the needle advancement was performed with the C-arm in the lateral view.  When the needle tip was visualized to be in the left L4 neural foramen, 1 mL of Isovue contrast dye was injected.  There was spread of dye revealing left L4 nerve root.  After negative aspiration, a 3 mL solution containing 80 mg of triamcinolone and 1 mL of 1 percent lidocaine was injected in increments. The stylet was reinserted and then removed. After the procedure, the patient's blood pressure, heart rate, oxygen saturation, and VAS pain score were recorded in the chart.        There were no complications.  The patient tolerated the procedure well and was brought to the room for observation in stable condition and discharged with written discharge instructions.     PLAN OF CARE:  The patient is to follow up in the interventional spine clinic in 6 weeks. The patient was advised to contact the interventional spine center for any of the following:    Fever, chills, or night sweats.  New onset severe sharp pain.  Any new upper or lower extremity weakness or numbness.  Any questions regarding the procedure.     If unable to contact the interventional spine center, the patient was instructed to go to the local emergency room.           Estimated blood loss: none or minimal  Specimens: none  Patient tolerated the procedure well with no immediate complications. Pressure was applied, and hemostasis was accomplished.

## 2019-07-21 NOTE — Progress Notes

## 2019-07-21 NOTE — Progress Notes
SPINE CENTER  INTERVENTIONAL PAIN PROCEDURE HISTORY AND PHYSICAL    Chief Complaint   Patient presents with   ? Procedure       HISTORY OF PRESENT ILLNESS:  Hannah Stewart is a 54 y.o. year old female who presents for injection.  Denies fevers, chills, or recent hospitalizations.  No allergies to contrast, latex, seafood, iodine, or shellfish.  Patient denies blood thinning medications.        Medical History:   Diagnosis Date   ? Depression    ? Essential hypertension, benign        Surgical History:   Procedure Laterality Date   ? TONSILLECTOMY  1975   ? ANKLE SURGERY  1989   ? TUBAL LIGATION  1999   ? HYSTERECTOMY  2003   ? CARPAL TUNNEL RELEASE Bilateral 2013       family history is not on file.    Social History     Socioeconomic History   ? Marital status: Married     Spouse name: Not on file   ? Number of children: Not on file   ? Years of education: Not on file   ? Highest education level: Not on file   Occupational History   ? Not on file   Tobacco Use   ? Smoking status: Current Every Day Smoker     Packs/day: 1.00   ? Smokeless tobacco: Never Used   Substance and Sexual Activity   ? Alcohol use: Not Currently   ? Drug use: Not Currently   ? Sexual activity: Not on file   Other Topics Concern   ? Not on file   Social History Narrative   ? Not on file       No Known Allergies    There were no vitals filed for this visit.    REVIEW OF SYSTEMS: 10 point ROS obtained and negative except for back pain      PHYSICAL EXAM:  General: 54 y.o. female appears stated age, in no acute distress  HEENT: Normocephalic, atraumatic  Neck: No thyroidmegaly  Cardiovascular: Well perfused  Pulmonary: Unlabored respirations  Extremities: No cyanosis, clubbing, or edema  Skin: No lesions seen on exposed skin  Psychiatric:  Appropriate mood and affect  Musculoskeletal: No atrophy.   Neurologic: Antigravity strength in all extremities. CN II -XII grossly intact.  Alert and oriented x 3.         IMPRESSION: 1. Degenerative scoliosis in adult patient    2. DDD (degenerative disc disease), lumbar    3. Foraminal stenosis of lumbar region    4. Spinal stenosis of lumbar region with radiculopathy    5. Spondylolisthesis of lumbar region    6. Lumbar radicular pain    7. Lumbar pain         PLAN:   Left L4 TFESI

## 2019-07-21 NOTE — Patient Instructions
Procedure Completed Today: Lumbar Transforaminal Steroid Injection    Important information following your procedure today: You may drive today    1. Pain relief may not be immediate. It is possible you may even experience an increase in pain during the first 24-48 hours followed by a gradual decrease of your pain.  2. Though the procedure is generally safe and complications are rare, we do ask that you be aware of any of the following:   ? Any swelling, persistent redness, new bleeding, or drainage from the site of the injection.  ? You should not experience a severe headache.  ? You should not run a fever over 101? F.  ? New onset of sharp, severe back & or neck pain.  ? New onset of upper or lower extremity numbness or weakness.  ? New difficulty controlling bowel or bladder function after the injection.  ? New shortness of breath.    If any of these occur, please call to report this occurrence to a nurse at [phone]. If you are calling after 4:00 p.m., on weekends or holidays please call [phone] and ask to have the resident physician on call for the physician paged or go to your local emergency room.  3. You may experience soreness at the injection site. Ice can be applied at 20 minute intervals. Avoid application of direct heat, hot showers or hot tubs today.  4. Avoid strenuous activity today. You may resume your regular activities and exercise tomorrow.  5. Patients with diabetes may see an elevation in blood sugars for 7-10 days after the injection. It is important to pay close attention to your diet, check your blood sugars daily and report extreme elevations to the physician that treats your diabetes.  6. Patients taking a daily blood thinner can resume their regular dose this evening. 7. It is important that you take all medications ordered by your pain physician. Taking medication as ordered is an important part of your pain care plan. If you cannot continue the medication plan, please notify the physician.     Possible side effects to steroids that may occur:  ? Flushing or redness of the face  ? Irritability  ? Fluid retention  ? Change in women?s menses    The following medications were used: Lidocaine , Triamcinolone   and Contrast Dye

## 2019-09-18 IMAGING — MR Head^Brain
8 of 9 series · 39 of 48 positions shown · non-contrast
Comparison: none

[Series 2: T1 · sagittal · 5.0mm · 0.45mm/px · 3 of 19 slices shown (1 of 2)]
[im 1/19]
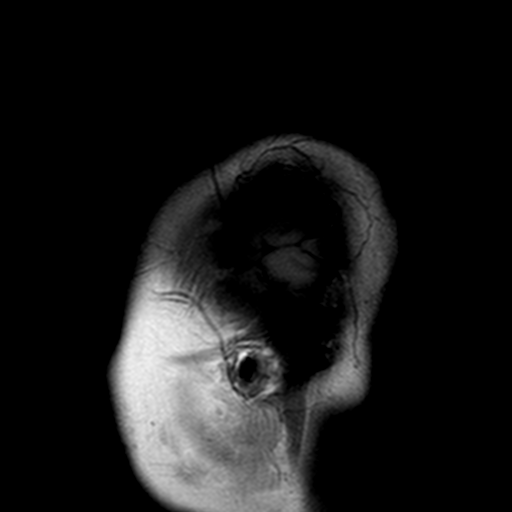
[im 10/19]
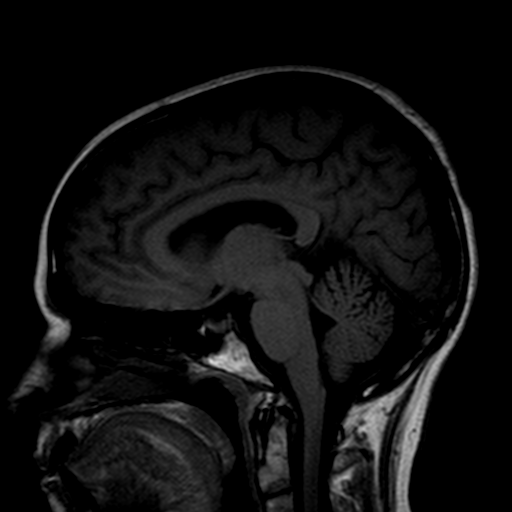
[im 19/19]
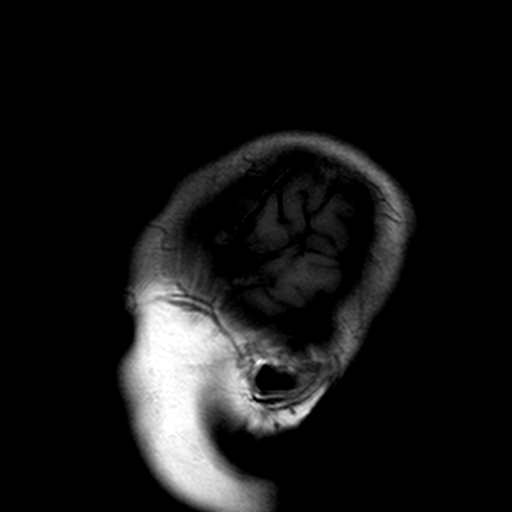

[Series 3: DWI · axial · 5.0mm · 1.80mm/px · z∈[-49,+80]mm · 9 of 63 slices shown (1 of 2)]
[im 1/63]
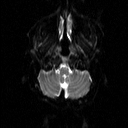
[im 11/63]
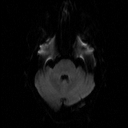
[im 21/63]
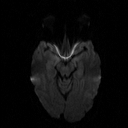
[im 26/63]
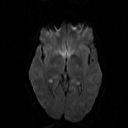
[im 32/63]
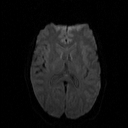
[im 37/63]
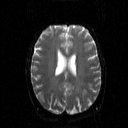
[im 42/63]
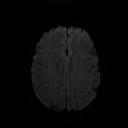
[im 52/63]
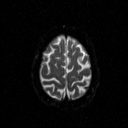
[im 63/63]
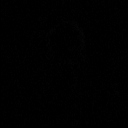

[Series 4: DWI · axial · 5.0mm · 1.80mm/px · z∈[-49,+80]mm · 4 of 21 slices shown (2 of 2)]
[im 1/21]
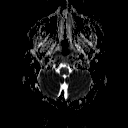
[im 7/21]
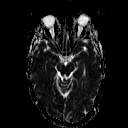
[im 14/21]
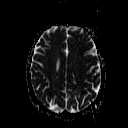
[im 21/21]
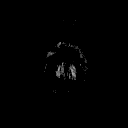

[Series 5: FLAIR · axial · 5.0mm · 0.45mm/px · z∈[-73,+75]mm · 5 of 24 slices shown]
[im 1/24]
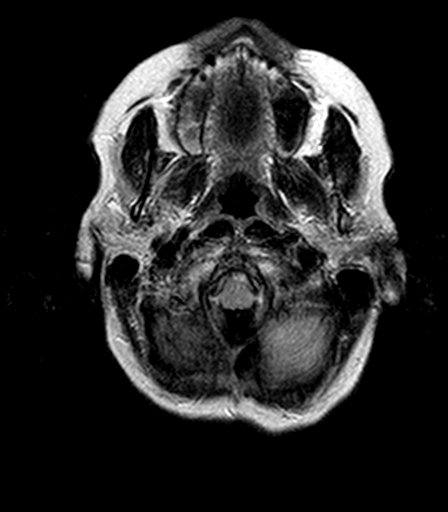
[im 6/24]
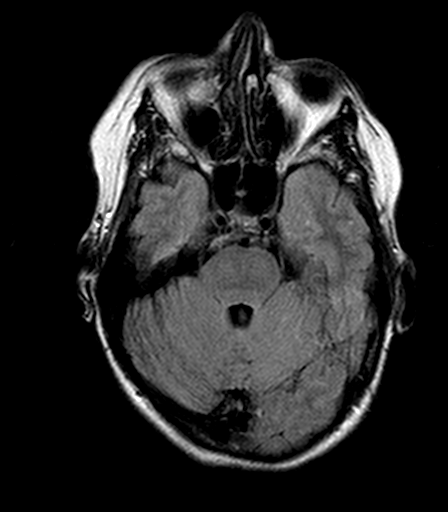
[im 12/24]
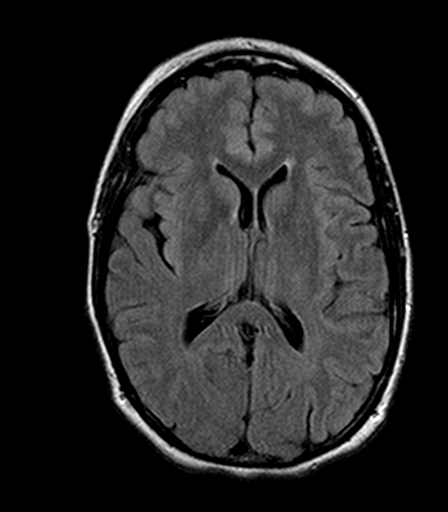
[im 18/24]
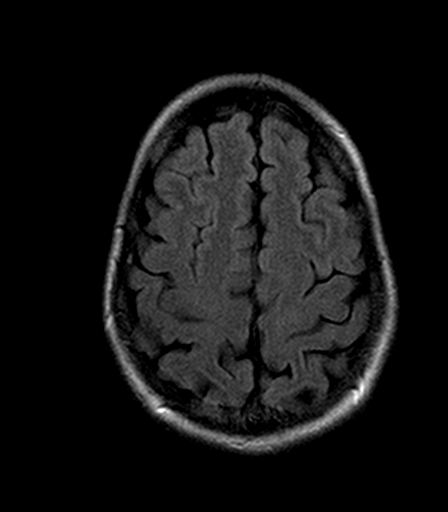
[im 24/24]
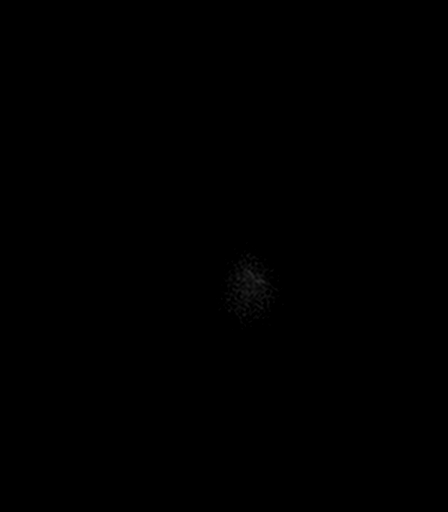

[Series 6: T2 · axial · 5.0mm · 0.72mm/px · z∈[-73,+75]mm · 5 of 23 slices shown (1 of 2)]
[im 1/23]
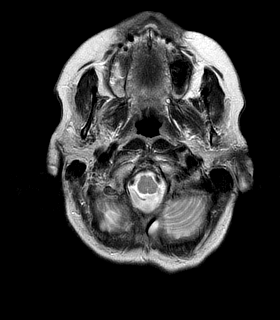
[im 6/23]
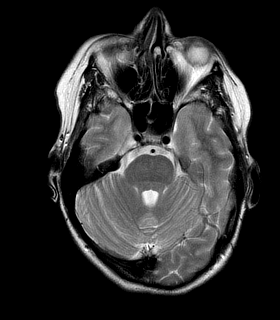
[im 12/23]
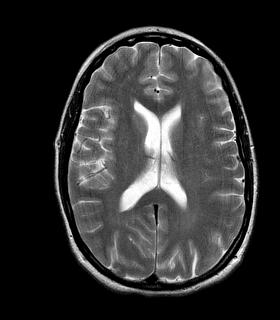
[im 17/23]
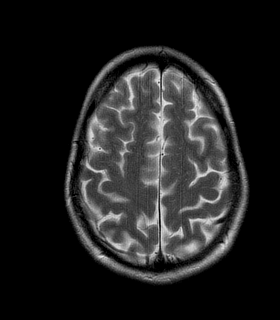
[im 23/23]
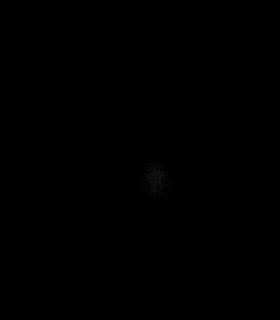

[Series 7: T1 · axial · 5.0mm · 0.45mm/px · z∈[-73,+75]mm · 5 of 24 slices shown (2 of 2)]
[im 1/24]
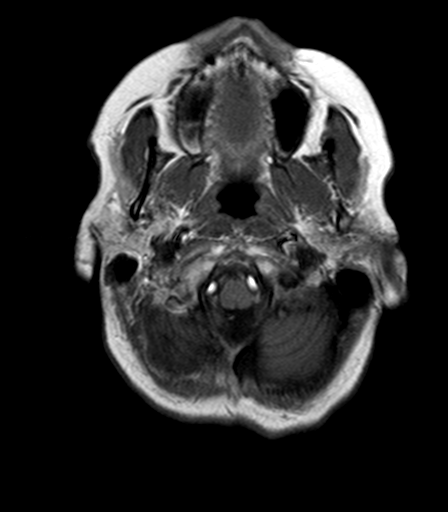
[im 6/24]
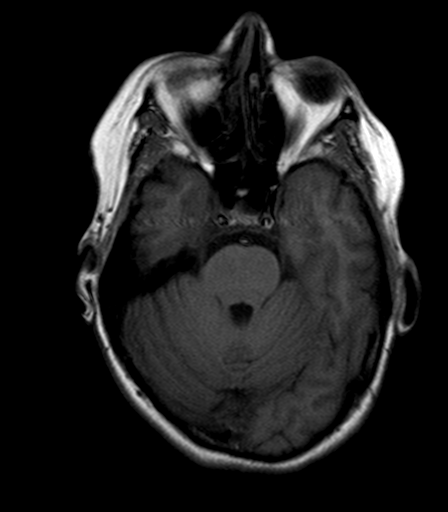
[im 12/24]
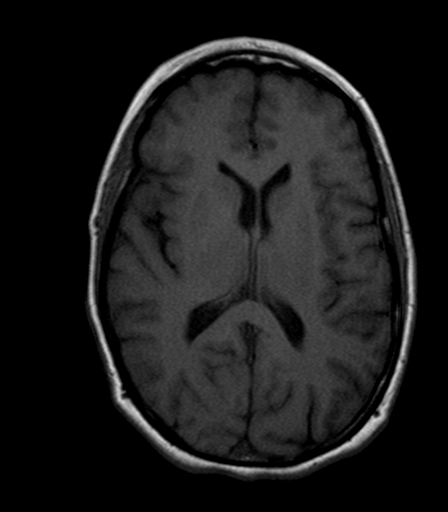
[im 18/24]
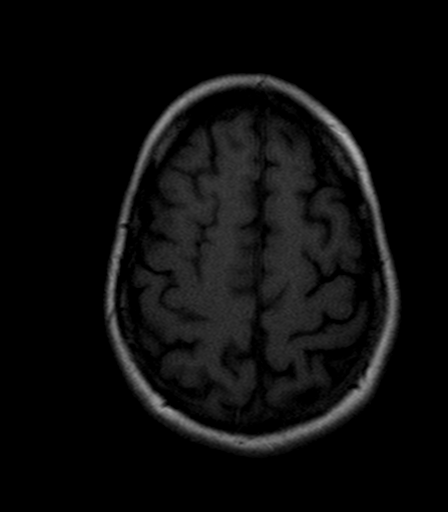
[im 24/24]
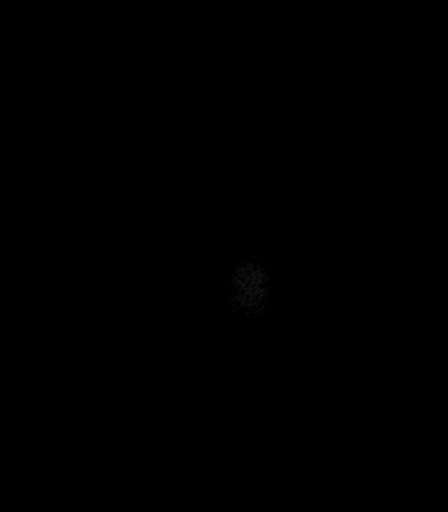

[Series 8: axial blood · axial · 5.0mm · 0.45mm/px · z∈[-63,+20]mm · 3 of 21 slices shown]
[im 1/21]
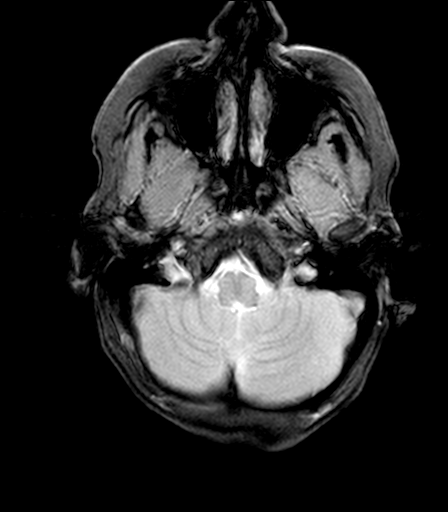
[im 7/21]
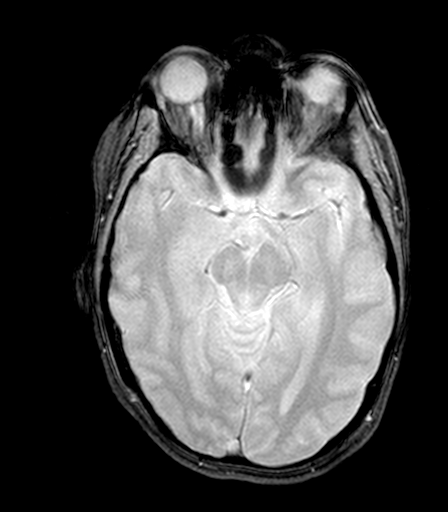
[im 14/21]
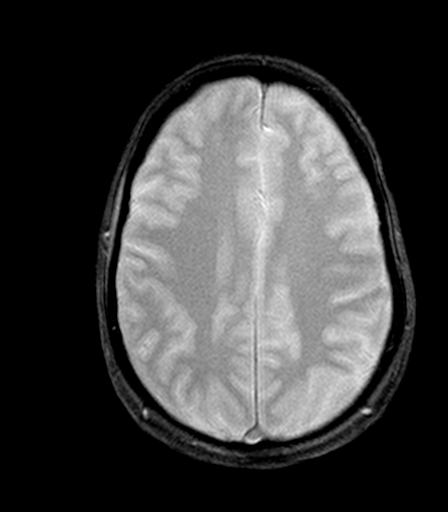

[Series 9: T2 · coronal · 5.0mm · 0.69mm/px · 5 of 26 slices shown (2 of 2)]
[im 1/26]
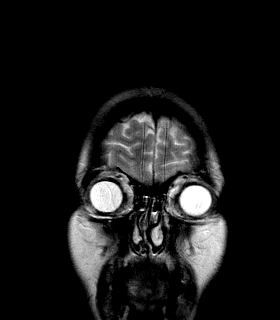
[im 7/26]
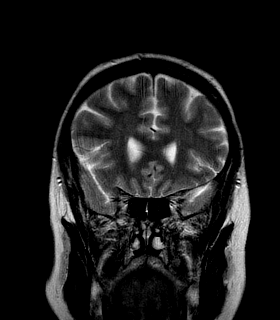
[im 13/26]
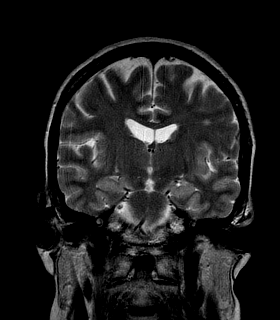
[im 19/26]
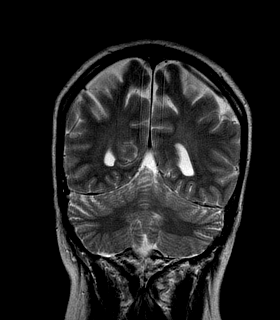
[im 26/26]
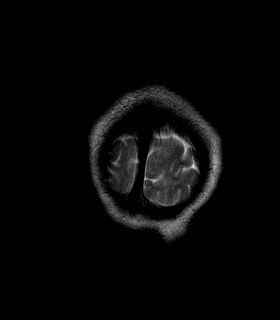

[39 of 48 positions shown; findings below may reference images not displayed]

DIAGNOSTIC STUDIES

EXAM

Brain MRI.

INDICATION

vision changes, fam hx MS, lightheaded
Persistant ringing in left ear.  Visual changes over the past 6 months.  Family history of MS.  ABRAN

TECHNIQUE

Noncontrast MRI of the brain.

COMPARISONS

No prior studies are available for comparison.

FINDINGS

There is no diffusion restriction to suggest acute or subacute ischemia. The lateral ventricles and
other CSF spaces are symmetric in size and are age appropriate. No midline shift. No mass effect. A
few scattered bright T2/FLAIR signal intensities are noted in the centrum semiovale, most pronounced
in the left s posterior subcortical frontal lobe region, nonspecific but likely due to chronic small
vessel ischemic change. No Dawson's fingers. Gray and white signal characteristics are otherwise
well preserved. There is no intracranial hemorrhage. The paranasal sinuses and mastoid air cells are
clear.

IMPRESSION

No compelling evidence of acute intracranial pathology. Nonspecific bright T2/FLAIR signal lesions
in the white matter, most likely sequela of mild chronic small vessel ischemic change.

Tech Notes:

Persistant ringing in left ear.  Visual changes over the past 6 months.  Family history of MS.
ABRAN

## 2019-10-20 MED ORDER — IOPAMIDOL 41 % IT SOLN
2.5 mL | Freq: Once | EPIDURAL | 0 refills | Status: CP
Start: 2019-10-20 — End: ?
  Administered 2019-10-20: 16:00:00 2.5 mL via EPIDURAL

## 2019-10-20 MED ORDER — DEXAMETHASONE SODIUM PHOS (PF) 10 MG/ML IJ SOLN
10 mg | Freq: Once | 0 refills | Status: CP
Start: 2019-10-20 — End: ?
  Administered 2019-10-20: 16:00:00 10 mg

## 2019-10-20 NOTE — Progress Notes

## 2019-10-20 NOTE — Procedures
Attending Surgeon: Curly Rim, MD    Anesthesia: Local    Pre-Procedure Diagnosis:   1. DDD (degenerative disc disease), lumbar    2. Spondylolisthesis of lumbar region    3. Lumbar radicular pain    4. Lumbar pain        Post-Procedure Diagnosis:   1. DDD (degenerative disc disease), lumbar    2. Spondylolisthesis of lumbar region    3. Lumbar radicular pain    4. Lumbar pain        [place] [facility] INJECT SNRB/TFESI LUMBAR/SACRAL  Procedure: transforaminal epidural    Laterality: left    Location: lumbar - L4-5      Consent:   Consent obtained: verbal and written  Consent given by: patient  Risks discussed: allergic reaction, bleeding, bruising, infection, nerve damage, no change or worsening in pain and reaction to medication    Discussed with patient the purpose of the treatment/procedure, other ways of treating my condition, including no treatment/ procedure and the risks and benefits of the alternatives. Patient has decided to proceed with treatment/procedure.        Universal Protocol:  Relevant documents: relevant documents present and verified  Test results: test results available and properly labeled  Imaging studies: imaging studies available  Required items: required blood products, implants, devices, and special equipment available  Site marked: the operative site was marked  Patient identity confirmed: Patient identify confirmed verbally with patient.        Time out: Immediately prior to procedure a time out was called to verify the correct patient, procedure, equipment, support staff and site/side marked as required      Procedures Details:   Indications: pain and diagnostic evaluation   Prep: chlorhexidine  Patient position: prone  Estimated Blood Loss: minimal  Specimens: none  Number of Levels: 1  Guidance: fluoroscopy  Contrast: Procedure confirmed with contrast under live fluoroscopy.  Needle and Epidural Catheter: quincke  Needle size: 25 G  Injection procedure: Incremental injection Patient tolerance: Patient tolerated the procedure well with no immediate complications. Pressure was applied, and hemostasis was accomplished.  Comments: DESCRIPTION OF PROCEDURE:  The procedure risks and benefits were explained to the patient.  Informed consent was obtained.  The patient was placed in the prone position on the fluoroscopy table with a pillow under the abdomen to help reduce lumbar lordosis.  Blood pressure cuff and oxygen saturation monitor were attached and  the patient was monitored throughout the entire procedure.  The L4 vertebral body was identified with the use of fluoroscopy in the AP view; the C-arm was obliqued to obtain a [employer].  The left L4 pedicle was visualized.  The skin was prepped using Chlorhexadine and draped in aseptic fashion.  The C-arm was rotated slightly obliquely towards the left side to visualize the area just below the foramen.  Skin and subcutaneous tissue were anesthetized using 3 mL of 1 percent lidocaine with a 27-gauge, 1-1/2 inch needle.  Next, a 3-1/2  inch 25-gauge spinal needle was slowly advanced to the 6 o'clock position to the left of the L4 pedicle just cephalad to the superior articular process.  The latter part of the needle advancement was performed with the C-arm in the lateral view.  When the needle tip was visualized to be in the left L4 neural foramen, 1 mL of Isovue contrast dye was injected.  There was spread of dye revealing left L4 nerve root.  After negative aspiration, a 2 mL solution containing 10  mg of dexamethasone and 1 mL of 1 percent lidocaine was injected in increments. The stylet was reinserted and then removed. After the procedure, the patient's blood pressure, heart rate, oxygen saturation, and VAS pain score were recorded in the chart.        There were no complications.  The patient tolerated the procedure well and was brought to the room for observation in stable condition and discharged with written discharge instructions. PLAN OF CARE:  The patient is to follow up in the interventional spine clinic in 3 weeks.     The patient was advised to contact the interventional spine center for any of the following:    Fever, chills, or night sweats.  New onset severe sharp pain.  Any new upper or lower extremity weakness or numbness.  Any questions regarding the procedure.     If unable to contact the interventional spine center, the patient was instructed to go to the local emergency room.           Estimated blood loss: none or minimal  Specimens: none  Patient tolerated the procedure well with no immediate complications. Pressure was applied, and hemostasis was accomplished.

## 2019-10-20 NOTE — Patient Instructions
Procedure Completed Today: Lumbar Transforaminal Steroid Injection    Important information following your procedure today: You may drive today    1. Pain relief may not be immediate. It is possible you may even experience an increase in pain during the first 24-48 hours followed by a gradual decrease of your pain.  2. Though the procedure is generally safe and complications are rare, we do ask that you be aware of any of the following:   ? Any swelling, persistent redness, new bleeding, or drainage from the site of the injection.  ? You should not experience a severe headache.  ? You should not run a fever over 101? F.  ? New onset of sharp, severe back & or neck pain.  ? New onset of upper or lower extremity numbness or weakness.  ? New difficulty controlling bowel or bladder function after the injection.  ? New shortness of breath.    If any of these occur, please call to report this occurrence to a nurse at [phone]. If you are calling after 4:00 p.m., on weekends or holidays please call [phone] and ask to have the resident physician on call for the physician paged or go to your local emergency room.  3. You may experience soreness at the injection site. Ice can be applied at 20 minute intervals. Avoid application of direct heat, hot showers or hot tubs today.  4. Avoid strenuous activity today. You may resume your regular activities and exercise tomorrow.  5. Patients with diabetes may see an elevation in blood sugars for 7-10 days after the injection. It is important to pay close attention to your diet, check your blood sugars daily and report extreme elevations to the physician that treats your diabetes.  6. Patients taking a daily blood thinner can resume their regular dose this evening.  7. It is important that you take all medications ordered by your pain physician. Taking medication as ordered is an important part of your pain care plan. If you cannot continue the medication plan, please notify the physician.     Possible side effects to steroids that may occur:  ? Flushing or redness of the face  ? Irritability  ? Fluid retention  ? Change in women?s menses    The following medications were used: Lidocaine , Decadron and Contrast Dye

## 2019-10-20 NOTE — Progress Notes
SPINE CENTER  INTERVENTIONAL PAIN PROCEDURE HISTORY AND PHYSICAL    Chief Complaint   Patient presents with   ? Lower Back - Pain       HISTORY OF PRESENT ILLNESS:  Hannah Stewart is a 54 y.o. year old female who presents for injection.  Denies fevers, chills, or recent hospitalizations.  Patient denies currently taking blood thinning medications.        Medical History:   Diagnosis Date   ? Depression    ? Essential hypertension, benign        Surgical History:   Procedure Laterality Date   ? TONSILLECTOMY  1975   ? ANKLE SURGERY  1989   ? TUBAL LIGATION  1999   ? HYSTERECTOMY  2003   ? CARPAL TUNNEL RELEASE Bilateral 2013       family history is not on file.    Social History     Socioeconomic History   ? Marital status: Married     Spouse name: Not on file   ? Number of children: Not on file   ? Years of education: Not on file   ? Highest education level: Not on file   Occupational History   ? Not on file   Tobacco Use   ? Smoking status: Current Every Day Smoker     Packs/day: 1.00   ? Smokeless tobacco: Never Used   Substance and Sexual Activity   ? Alcohol use: Not Currently   ? Drug use: Not Currently   ? Sexual activity: Not on file   Other Topics Concern   ? Not on file   Social History Narrative   ? Not on file       No Known Allergies    Vitals:    10/20/19 1032   BP: (!) 136/97   Pulse: 91   Temp: 36.4 ?C (97.6 ?F)   SpO2: 98%   Weight: 99.8 kg (220 lb)   Height: 172.7 cm (68)   PainSc: Six       REVIEW OF SYSTEMS: 10 point ROS obtained and negative except for back pain      PHYSICAL EXAM:  General: 54 y.o. female appears stated age, in no acute distress  HEENT: Normocephalic, atraumatic  Neck: No thyroidmegaly  Cardiovascular: Well perfused  Pulmonary: Unlabored respirations  Extremities: No cyanosis, clubbing, or edema  Skin: No lesions seen on exposed skin  Psychiatric:  Appropriate mood and affect  Musculoskeletal: No atrophy.   Neurologic: Antigravity strength in all extremities. CN II -XII grossly intact.  Alert and oriented x 3.         IMPRESSION:    1. DDD (degenerative disc disease), lumbar    2. Spondylolisthesis of lumbar region    3. Lumbar radicular pain    4. Lumbar pain         PLAN:  Left L4 TFESI

## 2019-12-16 ENCOUNTER — Encounter: Admit: 2019-12-16 | Discharge: 2019-12-16 | Payer: BC Managed Care – PPO

## 2019-12-16 ENCOUNTER — Ambulatory Visit: Admit: 2019-12-16 | Discharge: 2019-12-16 | Payer: BC Managed Care – PPO

## 2019-12-16 DIAGNOSIS — M419 Scoliosis, unspecified: Secondary | ICD-10-CM

## 2019-12-16 DIAGNOSIS — M47816 Spondylosis without myelopathy or radiculopathy, lumbar region: Secondary | ICD-10-CM

## 2019-12-16 DIAGNOSIS — M4317 Spondylolisthesis, lumbosacral region: Secondary | ICD-10-CM

## 2019-12-16 DIAGNOSIS — M5417 Radiculopathy, lumbosacral region: Secondary | ICD-10-CM

## 2019-12-16 DIAGNOSIS — I10 Essential (primary) hypertension: Secondary | ICD-10-CM

## 2019-12-16 DIAGNOSIS — F329 Major depressive disorder, single episode, unspecified: Secondary | ICD-10-CM

## 2019-12-16 DIAGNOSIS — M5136 Other intervertebral disc degeneration, lumbar region: Secondary | ICD-10-CM

## 2019-12-16 NOTE — Progress Notes
[facility] CLINIC NOTE       SUBJECTIVE: Ms. Howle is a 54 year old female who presents today for follow-up of radicular left low back pain in the setting of spondylolisthesis and lumbar degenerative disc disease.  She was last seen on 10/20/2019 at which time she received a left L4 TFESI.  She reports no improvement of her pain.  She ended up in the emergency department for right-sided back pain 5 days after the injection.  She eventually asked her primary care doctor for oral steroids which helped the right-sided radicular low back pain. She localizes the worst of her pain today to middle of her low back.  There is tingling down both her legs, past her knees.  The pain is constant - no aggravating factors.  The pain is better with laying down and putting her legs up.  VAS 5/10.         Review of Systems    Current Outpatient Medications:   ?  diclofenac potassium (CATAFLAM) 50 mg tablet, Take 50 mg by mouth three times daily., Disp: , Rfl:   ?  duloxetine DR (CYMBALTA) 60 mg capsule, Take 60 mg by mouth daily., Disp: , Rfl:   ?  losartan-hydrochlorothiazide (HYZAAR) 100-25 mg tablet, Take 1 tablet by mouth daily., Disp: , Rfl:   ?  omeprazole DR (PRILOSEC) 40 mg capsule, Take 40 mg by mouth daily., Disp: , Rfl:   ?  potassium chloride SR (K-DUR) 20 mEq tablet, Take 20 mEq by mouth daily., Disp: , Rfl:   ?  temazepam (RESTORIL) 7.5 mg capsule, as Needed., Disp: , Rfl:      No Known Allergies     Physical Exam  Vitals:    12/16/19 1315   BP: 129/76   BP Source: Arm, Right Upper   Patient Position: Sitting   Pulse: 109   Resp: 20   Temp: 36.6 ?C (97.8 ?F)   TempSrc: Oral   SpO2: 99%   Weight: 99.8 kg (220 lb)   Height: 172.7 cm (68)   PainSc: Five     Oswestry Total Score:: (P) 38  Pain Score: Five  Body mass index is 33.45 kg/m?Marland Kitchen    Constitutional: no acute distress, appears stated age  ENMT: supple  Cardiovascular: Well perfused  Respiratory: normal effort with respirations  Gastrointestinal: nondistended Extremities: No edema  Skin: Warm and dry  Psychiatric:  Appropriate mood and affect    Neuro:  MS:   Root Right Left   Hip Flexion L2 5 5   Knee Extension L3 5 5   Dorsiflexion L4 5 4   Plantarflexion S1 5 5   EHL Extension L5 5 5     Sensation: intact to light touch all BLE dermatomes.  Reflexes:  no hyperreflexia bilateral patella and Achilles  Negative Clonus bilaterally    MSK:  Inspection - no atrophy or swelling  Palpation - No other tenderness to palpation along the spinous process, facet joints, paraspinal musculature, SI joints, gluteal musculature, greater trochanters.    ROM:  Full ROM with flexion and extension.    Slump test Negative  Facet Loading Negative         IMPRESSION:  1. Lumbosacral radiculopathy    2. Lumbar degenerative disc disease    3. Spondylolisthesis of lumbosacral region    4. Scoliosis of thoracolumbar spine, unspecified scoliosis type    5. Lumbar spondylosis        DONIELLE FALEN is a pleasant 54 y.o. female who  is here today for follow up of lumbar radiculopathy in the setting of scoliosis, spondylolisthesis, DDD, and facet arthropathy.        PLAN:    1. Lifestyle Modifications:  Continue activity as tolerated.    2. Medications:  Continue current medications.  3. Therapy:  Continue current home exercise program.  4. Diagnostics:  No new imaging at this time.  5. Interventions:  We will schedule the patient for a right L2 and left L4 TFESI at next available.  We will use 40mg  kenalog for the left L4 TFESI and 5mg  dexamethasone for the right L2 TFESI.  6. Follow up:  Patient will follow up 2-3 weeks after injection.    The risks and benefits of above medications and interventions was discussed and patient verbalized understanding.  Patient was seen and discussed with Dr. Katrinka Blazing.    Edd Fabian, M.D.  Interventional Spine and Musculoskeletal Medicine Fellow  Physical Medicine and Rehabilitation    Portions of this note may have been created using Dragon, a voice recognition software program.  Please contact my office for clarification on any documentation.    ATTESTATION    I personally performed the key portions of the E/M visit, discussed case with fellow and concur with fellow documentation of history, physical exam, assessment, and treatment plan unless otherwise noted.    Ms. Mayol is a 54 year old female with history of hypertension and lumbar radiculopathy, who presents for follow-up on have low back pain.  She was last seen on 10/20/2019, at which point we provided with a left L4 transforaminal dural steroid injection.  She states shortly after the injection she had acute onset of right-sided back pain with radiation to the right thigh.  She has seen Dr.Carlson and has been counseled to quit smoking in order for surgical correction.  She states the pain is worse in the back.  This radiates into the leg, worse on the right.  VAS pain score is rated as a 5/10.    On examination she has tenderness on lumbar facets.  Decreased range of motion lumbar flexion lumbar extension.    MRI lumbar spine from 04/20/2019 was personally viewed demonstrated moderate right neuroforaminal stenosis at L2-L3 secondary to facet arthropathy small right lateral disc protrusion.  At L4-L5 there is severe left neuroforaminal stenosis secondary to left lateral disc protrusion and facet arthropathy.    Ms. Montel is a 54 year old female history of tobacco use who presents with increasing low back pain with radiation to right greater than left lower extremity.  Secondary to lumbar radiculopathy.  I would recommend a right L2 and left L4 transforaminal epidural steroid injection.  We will perform with 40 mg Kenalog on the left and 5 mg dexamethasone on the right.  She may continue with home exercise program.  I have asked her to continue tobacco cessation efforts.    Staff name: Curly Rim, MD  Date: 12/16/19

## 2020-01-18 ENCOUNTER — Encounter: Admit: 2020-01-18 | Discharge: 2020-01-18 | Payer: BC Managed Care – PPO

## 2020-01-18 DIAGNOSIS — M5416 Radiculopathy, lumbar region: Secondary | ICD-10-CM

## 2020-01-19 ENCOUNTER — Encounter: Admit: 2020-01-19 | Discharge: 2020-01-19 | Payer: BC Managed Care – PPO

## 2020-01-19 ENCOUNTER — Ambulatory Visit: Admit: 2020-01-19 | Discharge: 2020-01-19 | Payer: BC Managed Care – PPO

## 2020-01-19 DIAGNOSIS — M5416 Radiculopathy, lumbar region: Secondary | ICD-10-CM

## 2020-01-19 DIAGNOSIS — F329 Major depressive disorder, single episode, unspecified: Secondary | ICD-10-CM

## 2020-01-19 DIAGNOSIS — I10 Essential (primary) hypertension: Secondary | ICD-10-CM

## 2020-01-19 DIAGNOSIS — M5417 Radiculopathy, lumbosacral region: Secondary | ICD-10-CM

## 2020-01-19 MED ORDER — TRIAMCINOLONE ACETONIDE 40 MG/ML IJ SUSP
40 mg | Freq: Once | EPIDURAL | 0 refills | Status: CP
Start: 2020-01-19 — End: ?
  Administered 2020-01-19: 21:00:00 40 mg via EPIDURAL

## 2020-01-19 MED ORDER — DEXAMETHASONE SODIUM PHOS (PF) 10 MG/ML IJ SOLN
10 mg | Freq: Once | 0 refills | Status: CP
Start: 2020-01-19 — End: ?
  Administered 2020-01-19: 21:00:00 10 mg

## 2020-01-19 MED ORDER — IOPAMIDOL 41 % IT SOLN
2.5 mL | Freq: Once | EPIDURAL | 0 refills | Status: CP
Start: 2020-01-19 — End: ?
  Administered 2020-01-19: 21:00:00 2.5 mL via EPIDURAL

## 2020-01-19 NOTE — Procedures
Attending Surgeon: Joanie Coddington, MD    Anesthesia: Local    Pre-Procedure Diagnosis:   1. Lumbosacral radiculopathy        Post-Procedure Diagnosis:   1. Lumbosacral radiculopathy        [facility] INJECT SNRB/TFESI LUMBAR/SACRAL  Procedure: transforaminal epidural    Laterality: bilateral (Right L2-3/left L4-5)    Location: lumbar - L4-5      Consent:   Consent obtained: verbal and written  Consent given by: patient  Risks discussed: allergic reaction, bleeding, bruising, infection, nerve damage, no change or worsening in pain and reaction to medication    Discussed with patient the purpose of the treatment/procedure, other ways of treating my condition, including no treatment/ procedure and the risks and benefits of the alternatives. Patient has decided to proceed with treatment/procedure.        Universal Protocol:  Relevant documents: relevant documents present and verified  Test results: test results available and properly labeled  Imaging studies: imaging studies available  Required items: required blood products, implants, devices, and special equipment available  Site marked: the operative site was marked  Patient identity confirmed: Patient identify confirmed verbally with patient.        Time out: Immediately prior to procedure a time out was called to verify the correct patient, procedure, equipment, support staff and site/side marked as required      Procedures Details:   Indications: pain and diagnostic evaluation   Prep: chlorhexidine  Patient position: prone  Estimated Blood Loss: minimal  Specimens: none  Guidance: fluoroscopy  Contrast: Procedure confirmed with contrast under live fluoroscopy.  Needle and Epidural Catheter: quincke  Needle size: 25 G  Injection procedure: Incremental injection  Patient tolerance: Patient tolerated the procedure well with no immediate complications. Pressure was applied, and hemostasis was accomplished.        Estimated blood loss: none or minimal Specimens: none  Patient tolerated the procedure well with no immediate complications. Pressure was applied, and hemostasis was accomplished.

## 2020-01-19 NOTE — Progress Notes
[facility]  INTERVENTIONAL PAIN PROCEDURE HISTORY AND PHYSICAL    Chief Complaint   Patient presents with   ? Procedure       HISTORY OF PRESENT ILLNESS:  Hannah Stewart is a 54 y.o. year old female who presents for injection.  Denies fevers, chills, or recent hospitalizations.  Patient denies currently taking blood thinning medications.        Medical History:   Diagnosis Date   ? Depression    ? Essential hypertension, benign        Surgical History:   Procedure Laterality Date   ? TONSILLECTOMY  1975   ? ANKLE SURGERY  1989   ? TUBAL LIGATION  1999   ? HYSTERECTOMY  2003   ? CARPAL TUNNEL RELEASE Bilateral 2013       family history is not on file.    Social History     Socioeconomic History   ? Marital status: Married     Spouse name: Not on file   ? Number of children: Not on file   ? Years of education: Not on file   ? Highest education level: Not on file   Occupational History   ? Not on file   Tobacco Use   ? Smoking status: Current Every Day Smoker     Packs/day: 1.00   ? Smokeless tobacco: Never Used   Substance and Sexual Activity   ? Alcohol use: Not Currently   ? Drug use: Not Currently   ? Sexual activity: Not on file   Other Topics Concern   ? Not on file   Social History Narrative   ? Not on file       No Known Allergies    There were no vitals filed for this visit.    REVIEW OF SYSTEMS: 10 point ROS obtained and negative except for back pain      PHYSICAL EXAM:  General: 54 y.o. female appears stated age, in no acute distress  HEENT: Normocephalic, atraumatic  Neck: No thyroidmegaly  Cardiovascular: Well perfused  Pulmonary: Unlabored respirations  Extremities: No cyanosis, clubbing, or edema  Skin: No lesions seen on exposed skin  Psychiatric:  Appropriate mood and affect  Musculoskeletal: No atrophy.   Neurologic: Antigravity strength in all extremities. CN II -XII grossly intact.  Alert and oriented x 3.         IMPRESSION:    1. Lumbosacral radiculopathy         PLAN:   Right L2 and left L4 TFESI

## 2020-01-19 NOTE — Patient Instructions
Procedure Completed Today: Lumbar Transforaminal Steroid Injection    Important information following your procedure today: You may drive today    1. Pain relief may not be immediate. It is possible you may even experience an increase in pain during the first 24-48 hours followed by a gradual decrease of your pain.  2. Though the procedure is generally safe and complications are rare, we do ask that you be aware of any of the following:   ? Any swelling, persistent redness, new bleeding, or drainage from the site of the injection.  ? You should not experience a severe headache.  ? You should not run a fever over 101? F.  ? New onset of sharp, severe back & or neck pain.  ? New onset of upper or lower extremity numbness or weakness.  ? New difficulty controlling bowel or bladder function after the injection.  ? New shortness of breath.    If any of these occur, please call to report this occurrence to a nurse at [phone]. If you are calling after 4:00 p.m., on weekends or holidays please call [phone] and ask to have the resident physician on call for the physician paged or go to your local emergency room.  3. You may experience soreness at the injection site. Ice can be applied at 20 minute intervals. Avoid application of direct heat, hot showers or hot tubs today.  4. Avoid strenuous activity today. You may resume your regular activities and exercise tomorrow.  5. Patients with diabetes may see an elevation in blood sugars for 7-10 days after the injection. It is important to pay close attention to your diet, check your blood sugars daily and report extreme elevations to the physician that treats your diabetes.  6. Patients taking a daily blood thinner can resume their regular dose this evening.  7. It is important that you take all medications ordered by your pain physician. Taking medication as ordered is an important part of your pain care plan. If you cannot continue the medication plan, please notify the physician.     Possible side effects to steroids that may occur:  ? Flushing or redness of the face  ? Irritability  ? Fluid retention  ? Change in women?s menses    The following medications were used: Lidocaine , Triamcinolone  , Decadron and Contrast Dye

## 2020-01-19 NOTE — Progress Notes

## 2020-02-12 ENCOUNTER — Encounter: Admit: 2020-02-12 | Discharge: 2020-02-12 | Payer: BC Managed Care – PPO

## 2020-02-12 ENCOUNTER — Ambulatory Visit: Admit: 2020-02-12 | Discharge: 2020-02-12 | Payer: BC Managed Care – PPO

## 2020-02-12 DIAGNOSIS — F329 Major depressive disorder, single episode, unspecified: Secondary | ICD-10-CM

## 2020-02-12 DIAGNOSIS — G43719 Chronic migraine without aura, intractable, without status migrainosus: Secondary | ICD-10-CM

## 2020-02-12 DIAGNOSIS — R519 Generalized headaches: Secondary | ICD-10-CM

## 2020-02-12 DIAGNOSIS — M1990 Unspecified osteoarthritis, unspecified site: Secondary | ICD-10-CM

## 2020-02-12 DIAGNOSIS — H547 Unspecified visual loss: Secondary | ICD-10-CM

## 2020-02-12 DIAGNOSIS — R413 Other amnesia: Secondary | ICD-10-CM

## 2020-02-12 DIAGNOSIS — I10 Essential (primary) hypertension: Secondary | ICD-10-CM

## 2020-02-12 DIAGNOSIS — R42 Dizziness and giddiness: Secondary | ICD-10-CM

## 2020-02-12 DIAGNOSIS — M5417 Radiculopathy, lumbosacral region: Secondary | ICD-10-CM

## 2020-02-12 DIAGNOSIS — R1319 Other dysphagia: Secondary | ICD-10-CM

## 2020-02-12 DIAGNOSIS — Z92241 Personal history of systemic steroid therapy: Secondary | ICD-10-CM

## 2020-02-12 DIAGNOSIS — M25551 Pain in right hip: Secondary | ICD-10-CM

## 2020-02-12 DIAGNOSIS — G932 Benign intracranial hypertension: Secondary | ICD-10-CM

## 2020-02-12 DIAGNOSIS — M419 Scoliosis, unspecified: Secondary | ICD-10-CM

## 2020-02-12 DIAGNOSIS — G43909 Migraine, unspecified, not intractable, without status migrainosus: Secondary | ICD-10-CM

## 2020-02-12 MED ORDER — LIDOCAINE-PRILOCAINE 2.5-2.5 % TP CREA
Freq: Three times a day (TID) | TOPICAL | 3 refills | Status: AC | PRN
Start: 2020-02-12 — End: ?

## 2020-02-12 MED ORDER — TOPIRAMATE 25 MG PO TAB
ORAL_TABLET | Freq: Two times a day (BID) | 6 refills | Status: AC
Start: 2020-02-12 — End: ?

## 2020-02-12 NOTE — Progress Notes
Date of Service: 02/12/2020    Subjective:             Tomorrow Hannah Stewart is a 54 y.o. female here for evaluation of headaches/dizziness.    History of Present Illness  ASTER SCREWS 54 y.o. female with a past medical history of depression, back pain, HTN and tobacco use, here for evaluation of dizziness for the past year. She initially noticed episodes of distorted vision (slanted, denies blurred vision or vision loss, but reports diplopia, left eye is worse) in the past. This has been intermittent in the past few months, seems similar lately. Patient has had chronic headaches but they have been worse for the past year, they are localized on top of her head and also behind her left eye/frontal area, pain described as squeezing/pressure sensation, headaches are constant, they are associated with nausea, phonophobia and photophobia; denies association with visual/autonomic symptoms. Severity is 2-8/10 and they are persistent. She denies other triggers, denies clear association with positional changes and they are not prominent with cough; coffee may help. Patient reports chronic dizzy spells since last year, they are not associated with other symptoms (denies shortness of breath, palpitations or related to positional changes) and attacks may last for several minutes. Dizziness is not associated with headaches and patient denies vertigo. She also reports tinnitus (left ear, not clearly pulsating, but constant since last year) and possible mild hearing loss (bilateral). Dizzy spells may happen once a week lately, this has been better lately. She also reports balance issues for the past year, patient is able to walk without assistance but denies recent falls or weakness but she also describes bilateral hand paresthesias (both sides affected, mainly last 2 fingers) and also reports back pain, this is usually radiated to left leg. Patient had injections in the past. She denies neck pain, but has been forgetful, denies episodes of disorientation or significant confusion lately. Ophthalmological exam (08/2019) showed mild bilateral papilledema (mild nasal blurred margin). LP with no improvement of headaches, it is unclear if OP was measured.     Medications:    Preventive:  Duloxetine 60mg  qday (reports benefit with depression but not with headaches, denies side effects)    Abortive:  Tylenol with some benefit (taking 2 tablets per day), denies taking nausea meds, but she takes Benadryl with some benefit for sleep.   Diclofenac (reports some benefit for back pain).   Tried meclizine, but denies benefit.     Review of Records:  MRI brain (2021): no acute intracranial abnormalities. Nonspecific bright T2/Flair signal lesions in the white matter, most likely sequale of mild chronic vessel ischemic changes.   MRI lumbar spine (2017): Multilevel degenerative changes worst at the left L4-L5 level with likely a left foraminal disk extrusion and severe narrowing of the neural foramina.  CSF studies (2021): cell count 1, RBC 2, glucose 68, protein 46. VDRL negative. IgG index was normal, but OCB were present. Cytology was negative.    Labs in the past included unremarkable RPR, LFTs and kidney function, glucose was slightly high (109). B12 was 469 (2018).       Review of Systems   Constitutional: Positive for activity change, chills, diaphoresis and fatigue.   HENT: Positive for hearing loss, tinnitus and trouble swallowing.    Eyes: Positive for photophobia, pain and visual disturbance.   Respiratory: Positive for choking and shortness of breath.    Cardiovascular: Positive for palpitations.   Gastrointestinal: Positive for constipation and rectal pain.  Endocrine: Positive for polydipsia and polyuria.   Genitourinary: Positive for enuresis, frequency and urgency.   Musculoskeletal: Positive for back pain, joint swelling, myalgias and neck pain.   Neurological: Positive for dizziness, speech difficulty, light-headedness, numbness and headaches.   Psychiatric/Behavioral: Positive for agitation, confusion, decreased concentration, dysphoric mood and sleep disturbance. The patient is nervous/anxious.    All other systems reviewed and are negative.    Twelve-point review of systems was done in detail. Patient denies any recent mood changes but has history of depression and anxiety (stable lately).  The patient does report chronic history of snoring as well as drowsiness during the daytime. The patient reported that sleep has been abnormal with some insomnia in the past. The patient denies any history of kidney stones or glaucoma in the past. Denies hx of strokes, cardiac arrhythmias or CAD. She reports significant weight gain lately.       Medical History:   Diagnosis Date   ? Arthritis    ? Depression    ? Essential hypertension, benign    ? Generalized headaches    ? Memory loss    ? Migraines    ? Other dysphagia    ? Vision decreased      Patient denies head trauma or accidents in the past.    Surgical History:   Procedure Laterality Date   ? TONSILLECTOMY  1975   ? ANKLE SURGERY  1989   ? ANKLE ARTHROSCOPY Left 1989   ? TUBAL LIGATION  1999   ? HYSTERECTOMY  2003   ? CARPAL TUNNEL RELEASE Bilateral 2013   ? HERNIA REPAIR  05/2019       Social History     Socioeconomic History   ? Marital status: Married     Spouse name: Not on file   ? Number of children: Not on file   ? Years of education: Not on file   ? Highest education level: Not on file   Occupational History   ? Not on file   Tobacco Use   ? Smoking status: Current Every Day Smoker     Packs/day: 1.00   ? Smokeless tobacco: Never Used   Substance and Sexual Activity   ? Alcohol use: Not Currently   ? Drug use: Not Currently   ? Sexual activity: Yes     Partners: Male     Birth control/protection: Surgical   Other Topics Concern   ? Not on file   Social History Narrative   ? Not on file       Family History   Problem Relation Age of Onset   ? Hypertension Mother    ? Hypertension Father      No history of headaches/migraines reported.    ALLERGIES  No Known Allergies     Objective:         ? ALPRAZolam (XANAX) 0.25 mg tablet Take 0.25 mg by mouth as Needed.     ? busPIRone (BUSPAR) 10 mg tablet Take 10 mg by mouth twice daily.   ? diclofenac potassium (CATAFLAM) 50 mg tablet Take 50 mg by mouth three times daily.   ? diphenhydrAMINE (BENADRYL) 25 mg capsule Take 25 mg by mouth every 6 hours as needed.   ? duloxetine DR (CYMBALTA) 60 mg capsule Take 60 mg by mouth daily.   ? lidocaine/prilocaine (EMLA) 2.5/2.5 % topical cream Apply  topically to affected area three times daily as needed.   ? losartan-hydrochlorothiazide (HYZAAR) 100-25 mg tablet Take 1  tablet by mouth daily.   ? MELATONIN PO Take 10 mg by mouth daily.   ? omeprazole DR (PRILOSEC) 40 mg capsule Take 40 mg by mouth daily.   ? potassium chloride SR (K-DUR) 20 mEq tablet Take 20 mEq by mouth daily.   ? temazepam (RESTORIL) 7.5 mg capsule Take 7.5 mg by mouth as Needed.   ? temazepam (RESTORIL) 7.5 mg capsule as Needed.     Vitals:    02/12/20 1522   BP: 123/76   BP Source: Arm, Left Upper   Patient Position: Sitting   Pulse: 84   Weight: 106.1 kg (234 lb)   Height: 172.7 cm (67.99)   PainSc: Five     Body mass index is 35.59 kg/m?Marland Kitchen     Physical Exam  GENERAL APPEARANCE: The patient is alert. Patient is in no acute distress, following commands and cooperative. Well developed and well nourished.   HEENT: Normocephalic and atraumatic. No tenderness to palpation over sinuses or temporal area.  EXTREMITIES: no leg swelling.    NEUROLOGIC EXAM:   Orientation: The patient is alert and oriented times three. Patient is following commands. Speech is fluent, intact comprehension, repetition and naming.     Cranial nerves:  1st cranial nerve:  not tested   2nd cranial nerve: normal; Visual fields are full to confrontation. Pupils are equal, round, and reactive to light and accommodation. Non mydriatic funduscopic exam with bilateral papilledema.  3rd, 4th & 6th cranial nerves: Extraocular movements are intact without nystagmus.  5th cranial nerve: normal; intact muscles of mastication. Intact light touch and pin prick.  7th cranial nerve: normal; no facial asymmetry  8th cranial nerve: normal. No skew deviation. Normal HIT.   9th & 10th cranial nerves: normal; Gag is present   11th cranial nerve: normal; Shoulder shrug symmetric   12th cranial nerve: normal; tongue is midline.      Strength: (Right/Left) Deltoid 5/5, Biceps 5/5, Triceps 5/5, Finger ext 5/5, interossei 5/5, Hip Flexion 5/5, Knee ext 5/5, Knee flex 5/5, Ankle dorsiflexion 5/5, Ankle plantarflexion 5/5. Normal bulk and tone in all four limbs without any evidence of an arm drift.  No abnormal movements during exam.  Sensory: Intact to pain, temperature and vibration in both upper/lower extremities.   Coordination is intact finger-to-nose, fine finger movements and rapidly alternating movements.    Deep tendon reflexes are 2+ in biceps, triceps, brachioradialis and patellar bilaterally and 1+ ankle reflex.  Hoffman sign is absent bilaterally. Clonus was not elicited.   Gait is without ataxia, but cautious/antalgic features. Mild problems with tandem walking.    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 Patient with chronic history of headaches (worse since last year) and associated with features of migraines. She also reports episodes of distorted vision (including diplopia, denies vision loss), persistent tinnitus and occasional dizzy spells. Patient has had chronic low back pain and intermittent paresthesias as well as balance problems but denies recent falls. She reports significant weight gain lately, also reports sleep problems including insomnia. MRI brain was unremarkable in the past. Patient also had LP (unclear if she had OP with this test) and CSF studies were unremarkable (but patient had slightly high protein and high serum glucose, but normal cell count). Neurological exam shows no focal signs, but non mydriatic funduscopic exam with bilateral papilledema. We will order MRA/MRV to rule out vascular abnormalities due to persistent symptoms and exam findings. We will also start Topiramate to try  to optimize headache control, since medication may also help with IIH and weight loss, but we may also try Diamox in the near future if IIH is confirmed with new LP.     Recommendations:  1. We will start low dose Topiramate (initial goal 50mg  bid) for headaches/IIH. This medication may also help with weight loss. The patient was advised about common side effects with this medication use. We may titrate this medication up in the next few months if needed. We discussed with patient about importance of weight loss due to possible diagnosis of IIH.   2. MRA head with MRV to exclude vascular abnormalities given persistent symptoms reported.    3. We will refer the patient to our eye clinic (Dr. Melene Muller) for formal ophthalmological exam.   4. We will order labs and try to obtain LP with OP.   5. Follow-up appointment in three months.     Total time was 50 minutes. This time was spent preparing to see the patient, obtaining and/or reviewing history, performing an examination, ordering medications, tests/ procedures, communicating results to the patient/family, documenting clinical information in the electronic health record and counseling/educating the patient/family regarding headaches.

## 2020-02-12 NOTE — Progress Notes
Obtained patient's verbal consent to treat them and their agreement to [facility] financial policy and NPP via this telehealth visit during the [facility]    SPINE CENTER CLINIC NOTE       SUBJECTIVE:   Hannah Stewart is a 54 y.o.-year-old female with history of hypertension, depression, and scoliosis who presents for follow-up after right L2 and left L4 transforaminal injection on 01/19/20. Patient reports  relief with the injections. Patient reports at least  80% reduction in pain.  Patient states approximately 10 days after injection she noticed increased pain in the right lateral hip.  Patient states this occurred after her last injection as well.  She believes it is because her back and radicular symptoms are improved and she notices the hip pain more.  She denies any groin pain.  Patient is not currently doing any home exercises for her low back or the hip.  She is taking oral diclofenac 3 times a day.  She started taking muscle relaxers as needed as well as Tylenol with some relief in the hip pain.  She has recently switched jobs and is doing more of a sit down job which has helped with her back pain.  She is actively trying to quit smoking so she could be a candidate for surgery with Dr. Lisette Grinder.  Overall, she is happy with her current level of pain in the back. She is interested in having this repeated. Pain has not recurred.  Pain is not at preprocedure level. VAS pain score is rated a 5/10.  She able to complete all normal daily activities and feels as though her pain is now manageable. Denies balance difficulties or overt weakness in extremities. Denies recent falls. Denies any loss of control of bowel or bladder.        Review of Systems    Current Outpatient Medications:   ?  diclofenac potassium (CATAFLAM) 50 mg tablet, Take 50 mg by mouth three times daily., Disp: , Rfl:   ?  duloxetine DR (CYMBALTA) 60 mg capsule, Take 60 mg by mouth daily., Disp: , Rfl:   ? lidocaine/prilocaine (EMLA) 2.5/2.5 % topical cream, Apply  topically to affected area three times daily as needed., Disp: 30 g, Rfl: 3  ?  losartan-hydrochlorothiazide (HYZAAR) 100-25 mg tablet, Take 1 tablet by mouth daily., Disp: , Rfl:   ?  omeprazole DR (PRILOSEC) 40 mg capsule, Take 40 mg by mouth daily., Disp: , Rfl:   ?  potassium chloride SR (K-DUR) 20 mEq tablet, Take 20 mEq by mouth daily., Disp: , Rfl:   ?  temazepam (RESTORIL) 7.5 mg capsule, as Needed., Disp: , Rfl:   No Known Allergies  Physical Exam  Vitals:    02/12/20 0757   Weight: 99.8 kg (220 lb)   Height: 172.7 cm (68)   PainSc: Five     Oswestry Total Score:: 34  Pain Score: Five  Body mass index is 33.45 kg/m?Marland Kitchen  General: 54 y.o. female appears stated age, in no acute distress  HEENT: Normocephalic, atraumatic  Neck: No thyroidmegaly  Cardiovascular: Well perfused  Pulmonary: Unlabored respirations  Extremities: No cyanosis, clubbing, or edema  Skin: No lesions seen on exposed skin  Psychiatric:  Appropriate mood and affect  Musculoskeletal: No atrophy.   Neurologic: Antigravity strength in all extremities. CN II -XII grossly intact.  Alert and oriented x 3.        IMPRESSION:  1. Lumbosacral radiculopathy    2. Scoliosis of thoracolumbar spine, unspecified  scoliosis type    3. Greater trochanteric pain syndrome of right lower extremity    4. S/P epidural steroid injection        PLAN:    1.  Lifestyle modifications.  Recommend activity as tolerated.  Avoid provocative maneuvers.  Keep spine in neutral position.  2.  Medications.  Patient provided prescription for EMLA cream to apply to the lateral hip.  She may continue with other medications as previously prescribed.  Medication usage and safety reviewed.  3.  Therapy.  Patient provided with provider directed home exercises for the hip the low back.  4.  Imaging.  None indicated at this time.  5.  Interventions.  Would recommend repeating transforaminal epidural steroid injection when pain worsens or reoccurs.  May consider greater trochanter bursa injection as well.  6.  Follow-up.  Patient to follow-up as needed.    Todays visit took place via face-to-face encounter utilizing Zoom application. Total time 18 minutes.  Estimated counseling time 15 minutes.  Counseled Hannah Stewart regarding medications and exercises.

## 2020-02-13 LAB — CBC AND DIFF
Lab: 0 10*3/uL (ref 0–0.20)
Lab: 0.1 10*3/uL (ref 0–0.45)
Lab: 0.4 10*3/uL (ref 0–0.80)
Lab: 1 % (ref 0–2)
Lab: 14 % (ref 11–15)
Lab: 15 g/dL — ABNORMAL HIGH (ref 12.0–15.0)
Lab: 2.4 10*3/uL (ref 1.8–7.0)
Lab: 2.5 10*3/uL (ref 1.0–4.8)
Lab: 260 10*3/uL (ref 150–400)
Lab: 3 % (ref 0–5)
Lab: 32 pg (ref 26–34)
Lab: 33 g/dL (ref 32.0–36.0)
Lab: 4.7 M/UL (ref 4.0–5.0)
Lab: 44 % (ref 41–77)
Lab: 45 % — ABNORMAL HIGH (ref 24–44)
Lab: 5.6 10*3/uL (ref 4.5–11.0)
Lab: 7 % (ref 4–12)
Lab: 8.6 FL (ref 7–11)
Lab: 96 FL (ref 80–100)

## 2020-02-13 LAB — VITAMIN B12: Lab: 303 pg/mL — ABNORMAL HIGH (ref 180–914)

## 2020-02-13 LAB — TSH WITH FREE T4 REFLEX: Lab: 2.1 uU/mL (ref 0.35–5.00)

## 2020-02-13 LAB — PROTIME INR (PT): Lab: 1 (ref 0.8–1.2)

## 2020-02-15 ENCOUNTER — Encounter: Admit: 2020-02-15 | Discharge: 2020-02-15 | Payer: BC Managed Care – PPO

## 2020-02-23 ENCOUNTER — Encounter: Admit: 2020-02-23 | Discharge: 2020-02-23 | Payer: BC Managed Care – PPO

## 2020-03-04 ENCOUNTER — Encounter: Admit: 2020-03-04 | Discharge: 2020-03-04 | Payer: BC Managed Care – PPO

## 2020-03-04 ENCOUNTER — Ambulatory Visit: Admit: 2020-03-04 | Discharge: 2020-03-04 | Payer: BC Managed Care – PPO

## 2020-03-04 DIAGNOSIS — G932 Benign intracranial hypertension: Secondary | ICD-10-CM

## 2020-03-04 DIAGNOSIS — R42 Dizziness and giddiness: Secondary | ICD-10-CM

## 2020-03-04 DIAGNOSIS — R413 Other amnesia: Secondary | ICD-10-CM

## 2020-03-04 DIAGNOSIS — G43719 Chronic migraine without aura, intractable, without status migrainosus: Secondary | ICD-10-CM

## 2020-03-04 MED ORDER — GADOBENATE DIMEGLUMINE 529 MG/ML (0.1MMOL/0.2ML) IV SOLN
20 mL | Freq: Once | INTRAVENOUS | 0 refills | Status: CP
Start: 2020-03-04 — End: ?
  Administered 2020-03-04: 19:00:00 20 mL via INTRAVENOUS

## 2020-03-04 MED ORDER — SODIUM CHLORIDE 0.9 % IJ SOLN
50 mL | Freq: Once | INTRAVENOUS | 0 refills | Status: AC
Start: 2020-03-04 — End: ?

## 2020-03-04 NOTE — Telephone Encounter
Received call from radiology that patient present for MRA head imaging.  Note from Dr. Arrie Aran 02/12/2020 also stated need for MRV head w/wo.  Order placed.

## 2020-03-08 ENCOUNTER — Encounter: Admit: 2020-03-08 | Discharge: 2020-03-08 | Payer: BC Managed Care – PPO

## 2020-03-08 NOTE — Patient Education
Dear Ms. Apps,    Thank you for choosing [facility] Interventional Radiology for your procedure. Your appointment information is listed below:    Appointment Date: 03/29/20  Appointment Time:  2:00pm  Arrival Time: 1:00pm  Location:   ? [employer]: [address], [place], [place]  [address]  Parking: [address]       INTERVENTIONAL RADIOLOGY  PRE-PROCEDURE INSTRUCTIONS SEDATION    You are scheduled for a procedure in Interventional Radiology with procedural sedation.  Please follow these instructions and any direction from your Primary Care/Managing Physician.  If you have questions about your procedure or need to reschedule please call [phone].    Medication Instructions:   You may take the following medications with a small sip of water:  < ALL MEDICATIONS NORAMLLY >       Day of Exam Instructions:  1. Bathe or shower with an antibacterial soap prior to your appointment.  2. If you have a history of Obstructive Sleep Apnea (OSA) bring your CPAP/BIPAP.   3. Bring a list of your current medications and the dosages.  4. Wear comfortable clothing and leave valuables at home.  5. Arrive (1) hour prior to your appointment.  This time will be spent registering, interviewing, assessing, educating and preparing you for the test.  ? You will be with [place] anywhere from 30 minutes to 6 hours after your exam depending on your procedure.  6. You may be sedated for the procedure. A responsible adult must drive you home (no Benedetto Goad, taxis or buses are allowed) and stay with you overnight. If you do not have a driver we will be unable to perform your procedure.   7. You will not be able to return to work or drive the same day if receiving sedation.

## 2020-03-08 NOTE — Progress Notes
Interventional Radiology Outpatient Scheduling Checklist      1.  Name of Procedure(s):   Lumbar Puncture      2.  Date of Procedure:   03/29/20      3.  Arrival Time:   1300      4.  Procedure Time: 1400       5.  Correct Procedural Room Assignment:  CA 2      6.  Blood Thinners Triaged and instructed per protocol: Y/N/NA:  NA   Confirmed accurate instructions sent to patient: Y/N:  NA       7.  Procedure Order Verified: Y/N:  Yes      9.  Patient instructed to have a driver: Y/N/NA:  Yes     10.  Patient instructed on NPO status: Y/N/NA:  na  Confirmed accurate instructions sent to patient: Y/N:  Yes    11.  Specimen needed: Y/N/NA:  Yes   Verified Order placed: Y/N:  Yes    12.  Allergies Verified:  Y/N:  Yes    13.  Is there an Iodine Allergy: Y/N:  No  Does the Procedure Require contrast: Y/N:  na  If so, was the IR- Contrast Allergy Pre-Procedure Medication protocol ordered: Y/NA:  NA    14.  Does the patient have labs according to IR Pre-procedure Laboratory Parameter policy: Y/N/NA: na  If No, was the patient instructed to obtain labs prior to procedure: Y/N/NA:  NA     15.  Will the patient need to be admitted or have a possible admission: Y/N:  No  If yes, confirmed accurate instructions sent to patient: Y/N/NA:  NA     16.  Patient States Understanding: Y/N:  Yes    17.  History of OSA:  Y/N:  No   If yes, confirm request to bring CPAP sent to patient: Y/N/NA:  NA    18. Patient declines electronic procedure instructions: Y/N:  No mychart.

## 2020-03-10 ENCOUNTER — Encounter: Admit: 2020-03-10 | Discharge: 2020-03-10 | Payer: BC Managed Care – PPO

## 2020-03-16 ENCOUNTER — Encounter: Admit: 2020-03-16 | Discharge: 2020-03-16 | Payer: BC Managed Care – PPO

## 2020-03-16 NOTE — Progress Notes
Patient brought in MRI disk from [facility]. Images imported into chart. Mailing disk back to patient at address listed on file.

## 2020-03-29 ENCOUNTER — Encounter: Admit: 2020-03-29 | Discharge: 2020-03-29 | Payer: BC Managed Care – PPO

## 2020-03-29 ENCOUNTER — Ambulatory Visit: Admit: 2020-03-29 | Discharge: 2020-03-29 | Payer: BC Managed Care – PPO

## 2020-03-29 DIAGNOSIS — R519 Chronic primary headache: Secondary | ICD-10-CM

## 2020-03-29 DIAGNOSIS — R1319 Other dysphagia: Secondary | ICD-10-CM

## 2020-03-29 DIAGNOSIS — F329 Major depressive disorder, single episode, unspecified: Secondary | ICD-10-CM

## 2020-03-29 DIAGNOSIS — I10 Essential (primary) hypertension: Secondary | ICD-10-CM

## 2020-03-29 DIAGNOSIS — M1990 Unspecified osteoarthritis, unspecified site: Secondary | ICD-10-CM

## 2020-03-29 DIAGNOSIS — G43909 Migraine, unspecified, not intractable, without status migrainosus: Secondary | ICD-10-CM

## 2020-03-29 DIAGNOSIS — R413 Other amnesia: Secondary | ICD-10-CM

## 2020-03-29 DIAGNOSIS — G932 Benign intracranial hypertension: Secondary | ICD-10-CM

## 2020-03-29 DIAGNOSIS — H547 Unspecified visual loss: Secondary | ICD-10-CM

## 2020-03-29 LAB — GLUCOSE-CSF

## 2020-03-29 LAB — TOTAL PROTEIN-CSF: Lab: 49 mg/dL — ABNORMAL HIGH (ref 15–45)

## 2020-03-29 LAB — POC GLUCOSE: Lab: 111 mg/dL — ABNORMAL HIGH (ref 70–100)

## 2020-03-29 NOTE — Other
Immediate Post Procedure Note    Date:  03/29/2020                                         Attending Physician:   Shaterica Mcclatchy MD    Procedure(s):  LP  Pre/Post Diagnosis:  IIH  Description/Findings:  Op of 37, closing 24  Anesthesia:  2% lidocaine       Time out performed: Consent obtained, correct patient verified, correct procedure verified, correct site verified, patient marked as necessary.  Estimated Blood Loss:  None/Negligible  Specimen(s) Removed/Disposition:  10 ml clear csf  Complications: None    Jasmine Pang, MD

## 2020-03-29 NOTE — Progress Notes
Body mass index is 36.49 kg/m?Marland Kitchen      VISUAL FIELD, EXTEND     Visual Field Examination Type  [place] Automated  Laterality   Both eyes    Right Eye  Threshold: 24-2  Strategy:  SITA StandardStimulus Size:  IIIStimulus Color:  WhiteReliability:  GoodGlaucoma Hemifield Test:  Borderline    Left Eye  Threshold: 24-2  Strategy:  SITA StandardStimulus Size:  IIIStimulus Color:  WhiteReliability:  Fixation lossesGlaucoma Hemifield Test:  Within normal limitsResults:  Normal   Possible central point defect OD; possible inf/nasal defect OS but terrible reliability       OCT, NERVE     OCT Left Eye   Left Eye  Optic Nerve Findings: Normal    Right Eye   Right Eye  Optic Nerve Findings: Normal              Assessment and Plan:  This nice lady with history of chronic headache otherwise endorses none of the typical findings associated with intracranial hypertension--no obscurations, no pulsatile tinnitus, no binocular diplopia.  Her exam today is not dissimilar, demonstrating essentially normal visual field without peripheral field loss or enlarged blind spots.  Visual acuity, color vision, and pupils were normal OU.  Anterior segment was unremarkable.  Fundus exam showed small, cupless optic nerves without edema but also without spontaneous venous pulsations.    In summary, there is no clear papilledema but also no spontaneous venous pulsations confirming the ICP is normal.  We agree with proceeding with LP with measurement of the opening pressure.

## 2020-03-29 NOTE — H&P (View-Only)
IR Pre-Procedure History and Physical/Sedation Plan    Procedure Date: 03/29/2020     Planned Procedure(s):  Lumbar puncture    Indication:  Evaluation for IIH  __________________________________________________________________    Chief Complaint:  Chronic headaches    History of Present Illness: Hannah Stewart is a 54 y.o. female with a history as listed below who presents today for procedure.    There are no problems to display for this patient.    Medical History:   Diagnosis Date   ? Arthritis    ? Depression    ? Essential hypertension, benign    ? Generalized headaches    ? Memory loss    ? Migraines    ? Other dysphagia    ? Vision decreased       Surgical History:   Procedure Laterality Date   ? TONSILLECTOMY  1975   ? ANKLE SURGERY  1989   ? ANKLE ARTHROSCOPY Left 1989   ? TUBAL LIGATION  1999   ? HYSTERECTOMY  2003   ? CARPAL TUNNEL RELEASE Bilateral 2013   ? HERNIA REPAIR  05/2019      Social History     Tobacco Use   ? Smoking status: Current Every Day Smoker     Packs/day: 1.00   ? Smokeless tobacco: Never Used   Substance Use Topics   ? Alcohol use: Not Currently      Family History   Problem Relation Age of Onset   ? Hypertension Mother    ? Hypertension Father    ? Cataract Father    ? Amblyopia Neg Hx    ? Blindness Neg Hx    ? Glaucoma Neg Hx    ? Macular Degen Neg Hx    ? Retinal Detachment Neg Hx    ? Strabismus Neg Hx       Medications Prior to Admission   Medication Sig Dispense Refill Last Dose   ? ALPRAZolam (XANAX) 0.25 mg tablet Take 0.25 mg by mouth as Needed.        ? busPIRone (BUSPAR) 10 mg tablet Take 10 mg by mouth twice daily.      ? diclofenac potassium (CATAFLAM) 50 mg tablet Take 50 mg by mouth three times daily.      ? diphenhydrAMINE (BENADRYL) 25 mg capsule Take 25 mg by mouth every 6 hours as needed.      ? duloxetine DR (CYMBALTA) 60 mg capsule Take 60 mg by mouth daily.      ? lidocaine/prilocaine (EMLA) 2.5/2.5 % topical cream Apply  topically to affected area three times daily as needed. 30 g 3    ? losartan-hydrochlorothiazide (HYZAAR) 100-25 mg tablet Take 1 tablet by mouth daily.      ? MELATONIN PO Take 10 mg by mouth daily.      ? omeprazole DR (PRILOSEC) 40 mg capsule Take 40 mg by mouth daily.      ? potassium chloride SR (K-DUR) 20 mEq tablet Take 20 mEq by mouth daily.      ? temazepam (RESTORIL) 7.5 mg capsule Take 7.5 mg by mouth as Needed.      ? temazepam (RESTORIL) 7.5 mg capsule as Needed.      ? topiramate (TOPAMAX) 25 mg tablet Take 1 tab twice a day for 1 week, then 1 tab in the morning and 2 at bedtime for 1 week, then may take 2 tabs twice a day  Indications: migraine prevention 120 tablet 6  No Known Allergies    Review of Systems  A comprehensive review of systems was negative except for: Neurological: positive for headaches    Physical Exam:  Vital Signs: Last Filed In 24 Hours Vital Signs: 24 Hour Range   Height: 172.7 cm (68) (09/28 0959)            General appearance: Alert and no distress noted.  Neurologic: Grossly normal.  Lungs: Non labored at rest  Heart: Regular rate and rhythm  Abdomen: Non-distended    Pre-procedure anxiolysis plan: N/A  Sedation/Medication Plan: Local anesthetic  Personal history of sedation complications: Denies adverse event.   Family history of sedation complications: Denies adverse event.   Medications for Reversal: NA  Discussion/Reviews:  Physician has discussed risks and alternatives of this type of sedation and above planned procedures with patient    NPO Status: NA  Airway:  NA  Head and Neck: NA  Mouth: NA   Anesthesia Classification:  ASA II (A normal patient with mild systemic disease)  Pregnancy Status: Not Pregnant    Lab/Radiology/Other Diagnostic Tests:  Labs:  Pertinent labs reviewed           Xzaiver Vayda P Divine-Thiele, APRN-NP  Pager [phone]

## 2020-03-29 NOTE — Patient Instructions
INTERVENTIONAL RADIOLOGY AT THE [facility]  DISCHARGE INSTRUCTIONS  LUMBAR PUNCTURE  A lumbar puncture is a procedure that uses fluoroscopic (x-ray) guidance to insert a needle into the spinal canal in the lower portion of your back (the lumbar area).?A small amount of cerebrospinal fluid (CSF) is collected. CSF is a clear fluid that surrounds the brain and spinal cord and helps protect them from injury.?The collected fluid is then used for specific lab tests ordered by your physician.? In some instances, a medication or other substance may be injected. The pressure of the CSF may also be measured during the procedure.?  POST-PROCEDURE PAIN:  ? Pain control following your procedure is a priority for both you and your Physicians.  ? Some soreness or tenderness at the site is to be expected for several days. We recommend taking over the counter analgesics to help relieve this pain.  ? Alternative methods for pain relief include but not limited to heat or cold compress, relaxation techniques, rest, and changing of positions.?  ? If pain continues after 5-7 days or you have severe pain not relieved by medication, please contact us as directed below.?  POST-PROCEDURE ACTIVITY:  ? A?responsible adult must drive you home. ???  ? If you receive sedation, narcotic pain medication or anesthesia for the procedure, you should not drive or operate heavy machinery or do anything that requires concentration for at least 24 hours after procedure completion.  ? It is recommended that a responsible adult be with you until morning.  ? You should rest for 6-8 hours after the procedure with your head at a 30-45? angle (1-2 pillows).  ? If you develop a ?spinal? headache, lie flat for 24 hours.? A ?spinal? headache is caused by a CSF leak; a typical symptom is when your headache is worse when you are up that improves when lying flat.  ? You should rest today and avoid lifting or straining for 24 hours.? You may resume normal activity in 24 hours, only limited by residual pain.? Some residual pain is to be expected for a couple of days post procedure.?  POST-PROCEDURE SITE CARE:  ? You will have a small bandage over the procedure site.? Keep this dry.?  ? You may remove it in 24 hours.  ? You may shower in 24 hours, after removing the bandage.  ? Do not submerge the procedure site for 1 week (no bathtub, swimming, hot tub, etc.)  ? Do not use ointments, creams or powders on the puncture site.  ? Be sure your hands are clean when touching near the site.?  DIET/MEDICATIONS:  ? You may resume your previous diet after the procedure.  ? If you receive sedation or narcotic pain medications, avoid any foods or beverages containing alcohol for at least 24 hours after the procedure.  ? Please see the Medication Reconciliation sheet for instructions regarding resuming your home medications.  ? Keep well hydrated;?you are encouraged to drink plenty of caffeinated fluids as this can help to prevent a ?spinal? headache.???  CALL THE DOCTOR IF:?  ? You develop a?severe headache?or new onset neck stiffness  ? Bright red blood soaks the bandage.  ? You have pain not relieved by medication.? Some soreness at the site is to be expected.  ? You have signs of infection such as: fever greater than 101F, chills, redness, warmth, swelling, drainage or pus from the puncture site.  For any of the above symptoms or for problems or concerns related to the  procedure, call?[phone] Monday-Friday from 7-5p.? After-hours and weekends, please call?[phone] and ask for the Interventional Radiology Resident on-call.  ?You or your caregiver should call 911 for any severe symptoms such as excessive bleeding, severe dizziness, trouble breathing or loss of consciousness.?  ?  ?  ?

## 2020-04-06 ENCOUNTER — Encounter: Admit: 2020-04-06 | Discharge: 2020-04-06 | Payer: BC Managed Care – PPO

## 2020-04-06 NOTE — Telephone Encounter
Ok, thanks for letting me know. I will try to call her again after 4pm tomorrow.    Donald Pore        From: [facility] [email or web address]>  Sent: Monday, April 04, 2020 5:06:44 PM  To: Verdene Lennert [email or web address]>  Cc: Nani Ravens @[email or web address]>  Subject: RE: Hannah Stewart MR# [id number]      She left another VM for [name] at 4:01pm indicating she is available after 4pm aka after work. 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      From: Verdene Lennert @[email or web address]>   Sent: Monday, April 04, 2020 4:54 PM  To: Megan Vielhauer @[email or web address]>  Subject: RE: Hannah Stewart MR# [id number]     FYI - I tried to call her today, but she wasn?t available, I left a voice message with instructions to contact our clinic when possible, but I also plan to call her tomorrow.      Thanks!     Donald Pore        From: Aundra Millet Vielhauer [email or web address]>   Sent: Monday, April 04, 2020 12:22 PM  To: Verdene Lennert @[email or web address]>  Subject: Hannah Stewart MR# [id number]     Hannah Stewart MR# [id number]     Hey Dr. Arrie Aran. I am covering [name] today. Got VM from this patient wanting LP results. Doesn?t really say what results are in your result note, just that you want to discuss with her in clinic. Your next opening is not until 08/19/19. Do you want me to put her in that slot or overbook a clinic? Can this be done via telehealth? Also, do you want me to tell her anything regarding the results?

## 2020-09-07 ENCOUNTER — Encounter: Admit: 2020-09-07 | Discharge: 2020-09-07 | Payer: BC Managed Care – PPO

## 2020-09-07 ENCOUNTER — Ambulatory Visit: Admit: 2020-09-07 | Discharge: 2020-09-07 | Payer: BC Managed Care – PPO

## 2020-09-07 DIAGNOSIS — H547 Unspecified visual loss: Secondary | ICD-10-CM

## 2020-09-07 DIAGNOSIS — G932 Benign intracranial hypertension: Secondary | ICD-10-CM

## 2020-09-07 DIAGNOSIS — F32A Depression: Secondary | ICD-10-CM

## 2020-09-07 DIAGNOSIS — G43909 Migraine, unspecified, not intractable, without status migrainosus: Secondary | ICD-10-CM

## 2020-09-07 DIAGNOSIS — R519 Generalized headaches: Secondary | ICD-10-CM

## 2020-09-07 DIAGNOSIS — R413 Other amnesia: Secondary | ICD-10-CM

## 2020-09-07 DIAGNOSIS — R1319 Other dysphagia: Secondary | ICD-10-CM

## 2020-09-07 DIAGNOSIS — M1990 Unspecified osteoarthritis, unspecified site: Secondary | ICD-10-CM

## 2020-09-07 DIAGNOSIS — I10 Essential (primary) hypertension: Secondary | ICD-10-CM

## 2020-09-07 LAB — BASIC METABOLIC PANEL
Lab: 0.8 mg/dL (ref 0.4–1.00)
Lab: 100 mg/dL (ref 70–100)
Lab: 112 MMOL/L — ABNORMAL HIGH (ref 98–110)
Lab: 144 MMOL/L (ref 137–147)
Lab: 22 mg/dL (ref 7–25)
Lab: 26 MMOL/L (ref 21–30)
Lab: 3.8 MMOL/L (ref 3.5–5.1)
Lab: 6 (ref 3–12)
Lab: 8.9 mg/dL (ref 8.5–10.6)
Lab: >60 mL/min (ref >60)

## 2020-09-07 MED ORDER — ACETAZOLAMIDE 250 MG PO TAB
750 mg | ORAL_TABLET | Freq: Two times a day (BID) | ORAL | 6 refills | 12.50000 days | Status: AC
Start: 2020-09-07 — End: ?

## 2020-09-07 NOTE — Progress Notes
Date of Service: 09/07/2020    Subjective:             Hannah Stewart is a 55 y.o. female here for follow up due to headaches.     History of Present Illness  Hannah Stewart is a 55 y.o. female with a past medical history of depression, back pain, HTN and tobacco use, here for follow up due to chronic headaches. Patient describes headaches for years, they are usually localized on frontal area (left frontal lately), pain described as squeezing/pressure sensation, headaches usually last for some hours, they were associated with nausea in the past (not lately), she denies recent phonophobia or photophobia; denies aura or visual/autonomic symptoms with them. Weather changes and stress may trigger headaches but she denies new triggers lately. Frequency has been almost daily lately (worse than before) and severity could be up to 7-8/10. Headaches were gone for several months last year (October until December) but they have been worse lately (for the past 2 months). Patient denies recent dizziness, diplopia or episodes of distorted vision lately, she denies vision loss or recent falls, but still reports constant non pulsatile tinnitus. 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 Patient denies recent sleep problems, but reports some weight gain.     Medications:??  Preventive:  Diamox 500mg  bid (reports benefit with headaches, denies side effects lately but noticed a rash initially)  Tried Duloxetine 60mg  qday (reports benefit with depression but not with headaches, denies side effects) and Topiramate (denies benefit with headaches)  ?  Abortive:  Tylenol?with some benefit (taking 2-4 tablets per day), denies taking nausea?meds, but she takes Benadryl with some benefit for sleep.   Diclofenac (reports some benefit for back pain).?  Tried meclizine, but denies benefit.   ?     Review of Systems   HENT: Positive for tinnitus.    Endocrine: Positive for polydipsia.   Neurological: Positive for numbness and headaches.       Medical History:   Diagnosis Date   ? Arthritis    ? Depression    ? Essential hypertension, benign    ? Generalized headaches    ? Memory loss    ? Migraines    ? Other dysphagia    ? Vision decreased        Surgical History:   Procedure Laterality Date   ? TONSILLECTOMY  1975   ? ANKLE SURGERY  1989   ? ANKLE ARTHROSCOPY Left 1989   ? TUBAL LIGATION  1999   ? HYSTERECTOMY  2003   ? CARPAL TUNNEL RELEASE Bilateral 2013   ? HERNIA REPAIR  05/2019       Social History     Socioeconomic History   ? Marital status: Married     Spouse name: Not on file   ? Number of children: Not on file   ? Years of education: Not on file   ? Highest education level: Not on file   Occupational History   ? Not on file   Tobacco Use   ? Smoking status: Current Every Day Smoker     Packs/day: 1.00   ? Smokeless tobacco: Never Used   Substance and Sexual Activity   ? Alcohol use: Not Currently   ? Drug use: Not Currently   ? Sexual activity: Yes     Partners: Male     Birth control/protection: Surgical   Other Topics Concern   ?  Not on file   Social History Narrative   ? Not on file       Family History   Problem Relation Age of Onset   ? Hypertension Mother    ? Hypertension Father    ? Cataract Father    ? Amblyopia Neg Hx    ? Blindness Neg Hx    ? Glaucoma Neg Hx    ? Macular Degen Neg Hx    ? Retinal Detachment Neg Hx    ? Strabismus Neg Hx        ALLERGIES  No Known Allergies    Objective:         ? acetaZOLAMIDE (DIAMOX) 250 mg tablet Start taking 250mg  bid for 1 week, then take 500mg  bid  Indications: pseudotumor cerebri, a condition with high fluid pressure in the brain   ? ALPRAZolam (XANAX) 0.25 mg tablet Take 0.25 mg by mouth as Needed.     ? diclofenac potassium (CATAFLAM) 50 mg tablet Take 50 mg by mouth twice daily.   ? diphenhydrAMINE (BENADRYL) 25 mg capsule Take 25 mg by mouth every 6 hours as needed.   ? lidocaine/prilocaine (EMLA) 2.5/2.5 % topical cream Apply  topically to affected area three times daily as needed.   ? losartan-hydrochlorothiazide (HYZAAR) 100-25 mg tablet Take 1 tablet by mouth daily.   ? MELATONIN PO Take 10 mg by mouth daily.   ? omeprazole DR (PRILOSEC) 40 mg capsule Take 40 mg by mouth daily.   ? potassium chloride SR (K-DUR) 20 mEq tablet Take 20 mEq by mouth daily.     Vitals:    09/07/20 1415   BP: 116/78   BP Source: Arm, Right Upper   Patient Position: Sitting   Pulse: 90   Weight: 108 kg (238 lb)   PainSc: Seven     Body mass index is 36.19 kg/m?Marland Kitchen     Physical Exam  GENERAL APPEARANCE: The patient is alert. Patient is in no acute distress, following commands and cooperative. Well developed and well nourished.   HEENT: Normocephalic and atraumatic.   EXTREMITIES: no leg swelling.    NEUROLOGIC EXAM:   Orientation: The patient is alert and oriented times three. Patient is following commands. Speech is fluent, intact comprehension, repetition and naming.     Cranial nerves:  1st cranial nerve:  not tested   2nd cranial nerve: normal; Visual fields are full to confrontation. Pupils are equal, round, and reactive to light and accommodation. Non mydriatic exam was limited today due to pupil size.    3rd, 4th & 6th cranial nerves: Extraocular movements are intact without nystagmus.  5th cranial nerve: normal; intact muscles of mastication. Intact light touch and pin prick.  7th cranial nerve: normal; no facial asymmetry  8th cranial nerve: normal  9th & 10th cranial nerves: normal; Gag is present   11th cranial nerve: normal; Shoulder shrug symmetric   12th cranial nerve: normal; tongue is midline.      Strength: (Right/Left) Deltoid 5/5, Biceps 5/5, Triceps 5/5, Finger ext 5/5, interossei 5/5, Hip Flexion 5/5, Knee ext 5/5, Knee flex 5/5, Ankle dorsiflexion 5/5, Ankle plantarflexion 5/5. Normal bulk and tone in all four limbs without any evidence of an arm drift.  No abnormal movements during exam.  Sensory: Intact to pain, temperature and vibration in both upper/lower extremities.   Coordination is intact finger-to-nose and rapidly alternating movements.    Deep tendon reflexes are 2+ in biceps, triceps, brachioradialis and patellar bilaterally and 1+ ankle  reflex.  Hoffman sign is absent bilaterally. Clonus was not elicited.   Gait is without ataxia. Mild problems on tandem walking.    LABS:  TSH   Date Value Ref Range Status   02/12/2020 2.11 0.35 - 5.00 MCU/ML Final     Hemoglobin A1C   Date Value Ref Range Status   02/12/2020 5.7 4.0 - 6.0 % Final     Comment:     The ADA recommends that most patients with type 1 and type 2 diabetes maintain   an A1c level <7%.       Assessment and Plan:  IIH. Patient thinks that her headaches were better last year with diamox use. However, she has noticed worsening headaches due to weather changes in the last few weeks/months. She reports good tolerance with Diamox. She also describes recent weight gain, but denies visual problems, balance issues or recent falls. Neurological exam seems stable today, but non mydriatic exam was limited. We will try a higher dose of Diamox to try to optimize headache control. We advised patient to schedule eye clinic appointment soon in order to monitor visual fields due to IIH. We also encouraged patient about weight loss.     Recommendations:  1. We will increase Diamox up to 750mg  bid for headache prophylaxis. The patient was advised about common side effects with this medication use. We may titrate this medication up in the next few months if needed. We will order labs due to Diamox use, including electrolytes and diabetes testing given history of paresthesias.   2. We will also use a limited amount of NSAIDs as abortive medications for headaches.   3. We discussed about the importance of weight loss and advised patient to schedule another appointment in the eye clinic.   4. Follow-up appointment in 4 months.     Total time was 30 minutes. This time was spent preparing to see the patient, obtaining and/or reviewing history, performing an examination, ordering medications, tests/ procedures, communicating results to the patient, documenting clinical information in the electronic health record and counseling/educating the patient regarding headaches.

## 2020-11-21 IMAGING — MG MAMMOGRAM 3D SCREEN, BILATERAL
8 series · 8 of 8 positions shown · non-contrast
Comparison: none

[R CC]
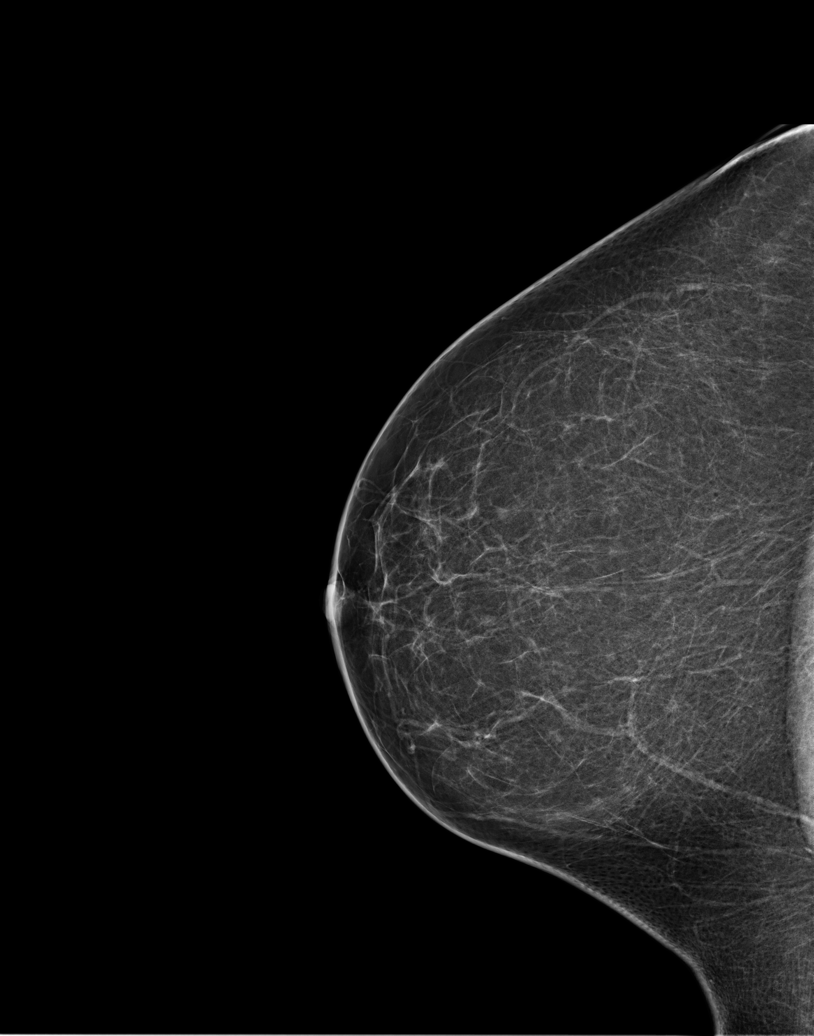

[R tomo (1 of 2)]
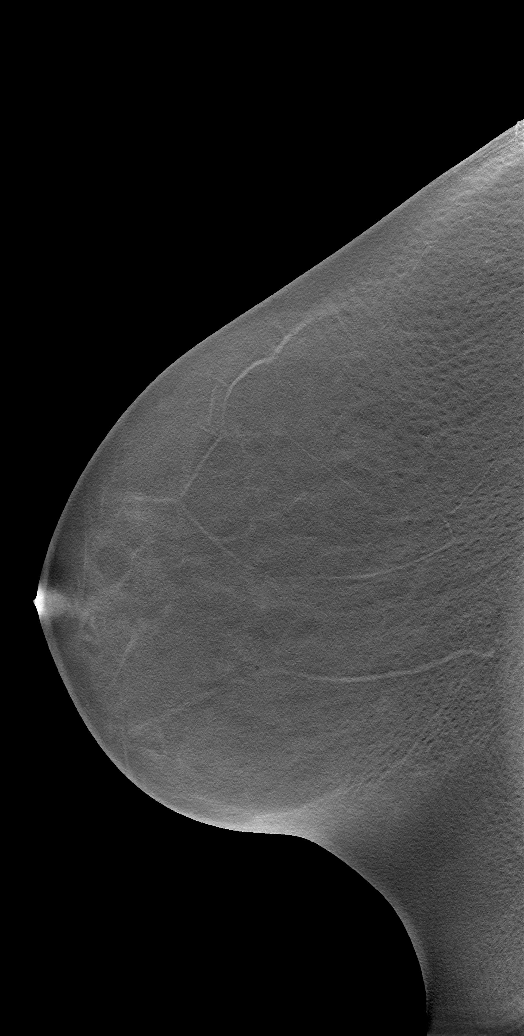

[L CC]
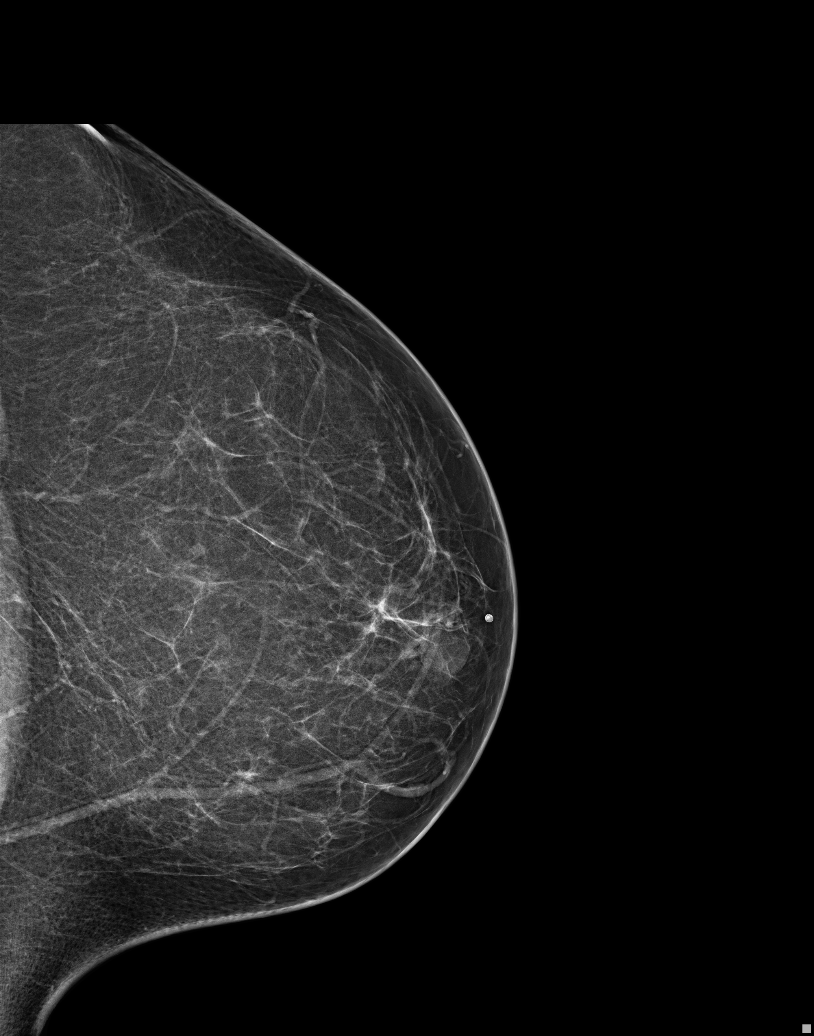

[L tomo (1 of 2)]
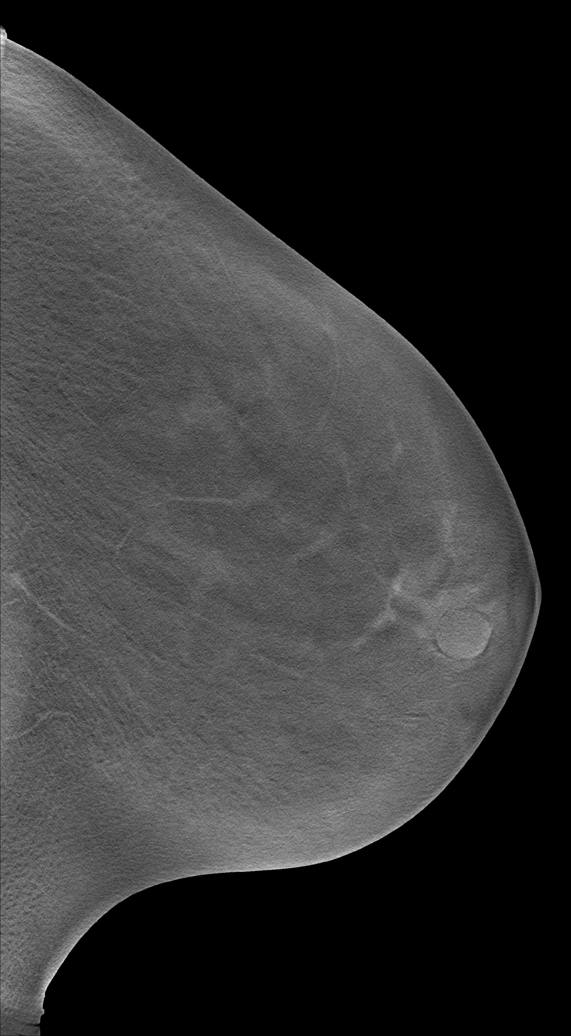

[R MLO]
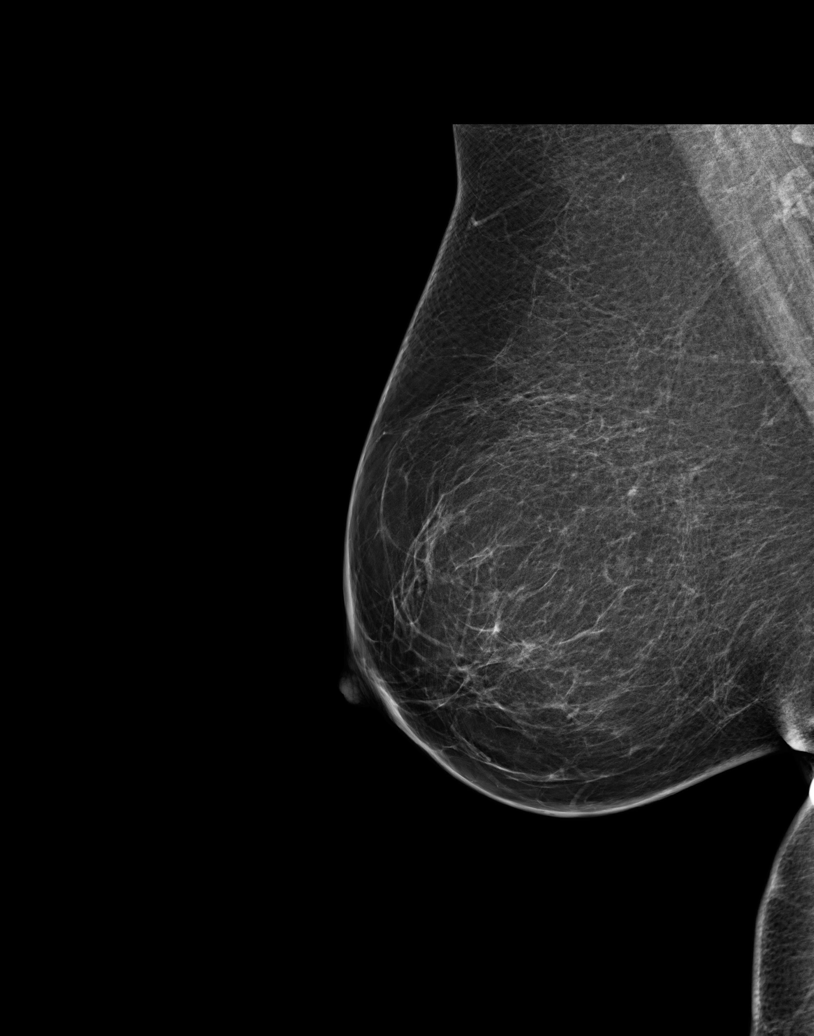

[R tomo (2 of 2)]
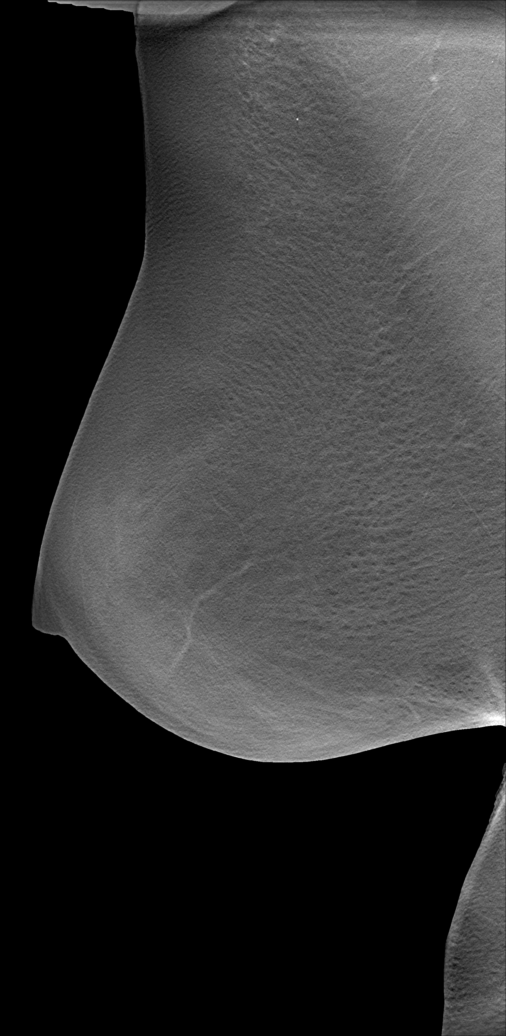

[L MLO]
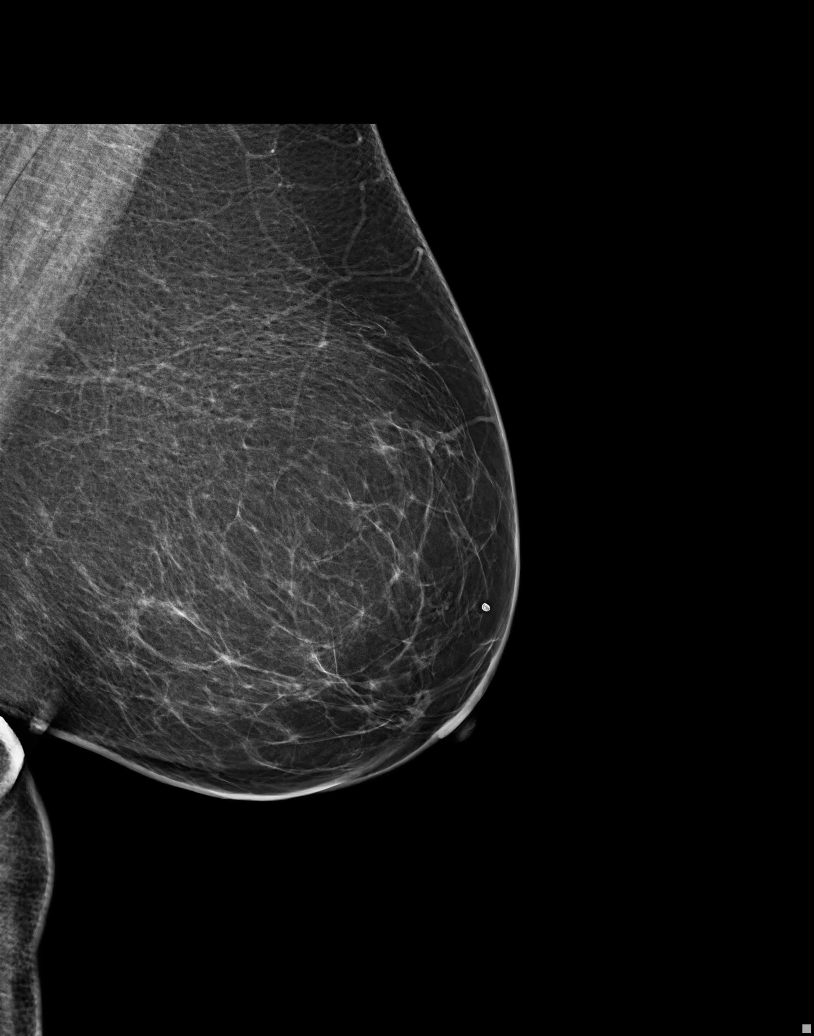

[L tomo (2 of 2)]
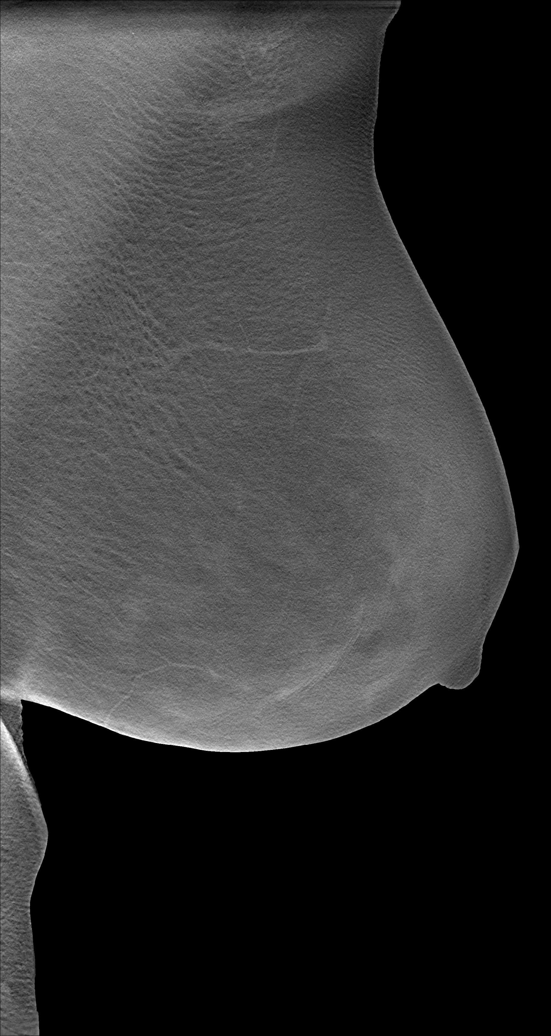

[8 of 8 positions shown; findings below may reference images not displayed]

EXAM

SCREENING MAMMOGRAM, BILATERAL

INDICATION

screening
SQUAMOUS CELL 9102.  SCREENING.  AB (3D) PRIORS: 6565.

TECHNIQUE

Bilateral craniocaudal and medial lateral oblique views were generated and reviewed with computer-
aided detection.

COMPARISONS

02/18/19 back through 03/21/16

FINDINGS

There are scattered elements of fibroglandular breast tissue.

No concerning calcifications are present. There are no suspicious masses.

IMPRESSION

BI-RADS 1, NEGATIVE

Tech Notes:

## 2021-05-03 ENCOUNTER — Encounter: Admit: 2021-05-03 | Discharge: 2021-05-03 | Payer: BC Managed Care – PPO

## 2021-05-03 MED ORDER — ACETAZOLAMIDE 250 MG PO TAB
ORAL_TABLET | Freq: Two times a day (BID) | 0 refills
Start: 2021-05-03 — End: ?

## 2021-06-08 IMAGING — US ABDLM
1 series · 14 of 25 positions shown · non-contrast
Comparison: none

[Series 1: us abdomen limited · 14 of 73 slices shown]
[im 1/73]
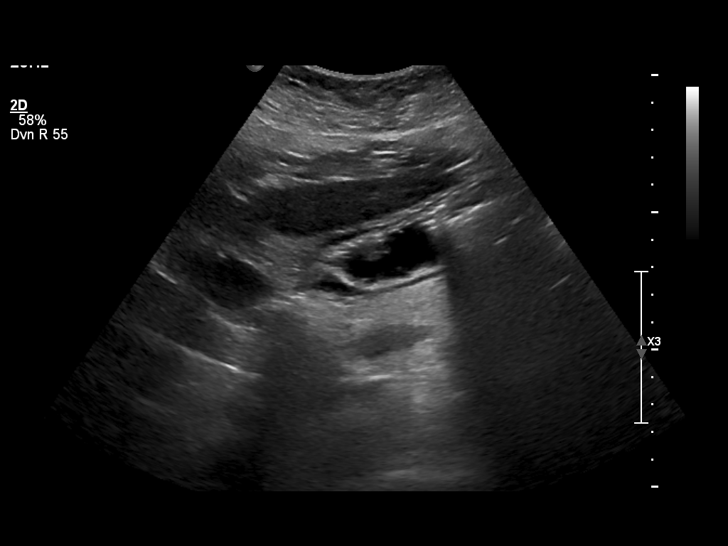
[im 7/73]
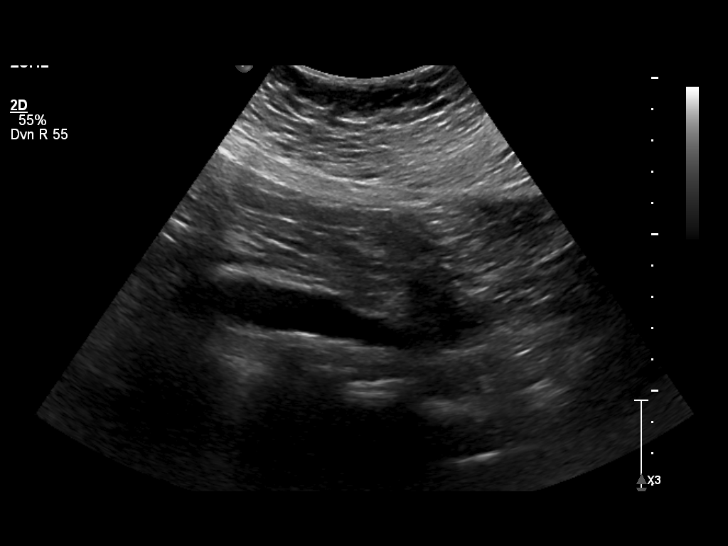
[im 13/73]
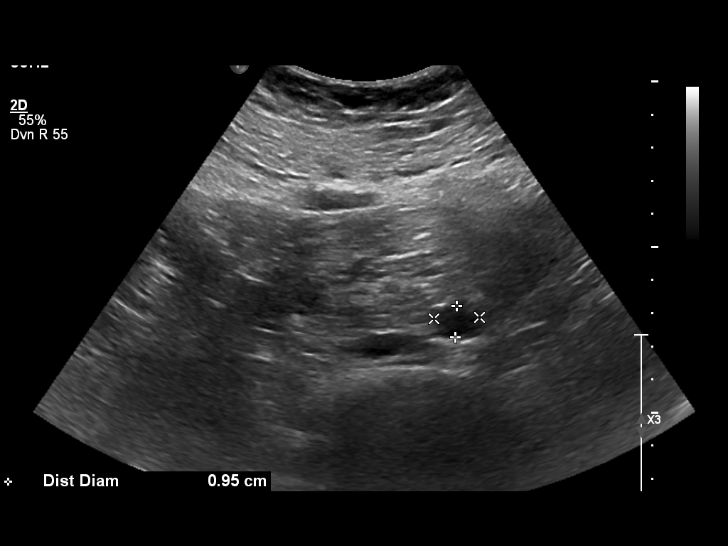
[im 19/73]
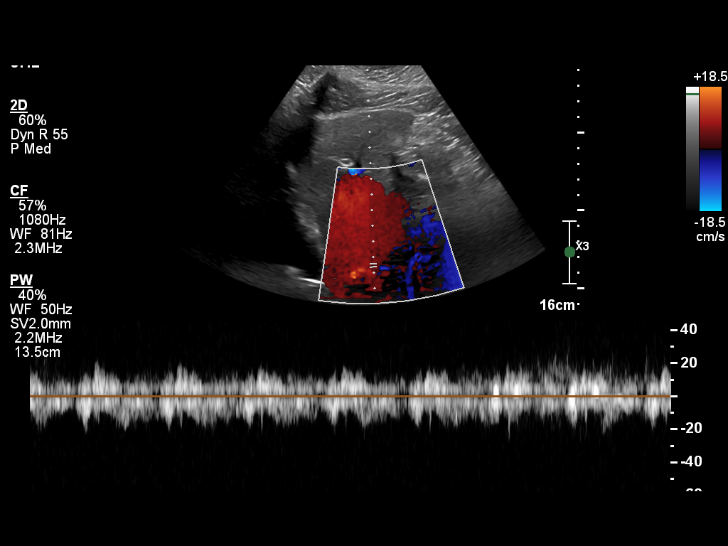
[im 25/73]
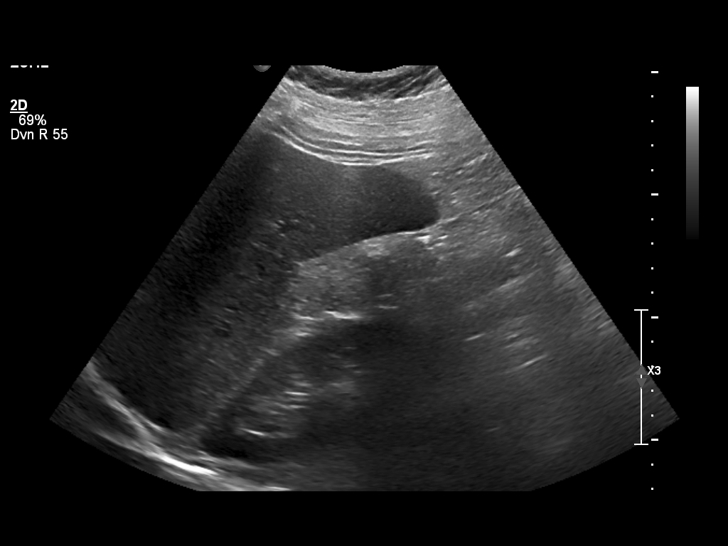
[im 28/73]
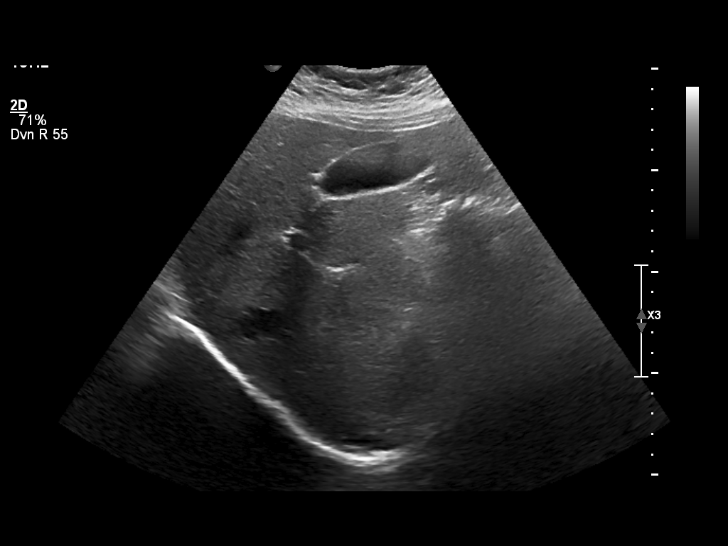
[im 34/73]
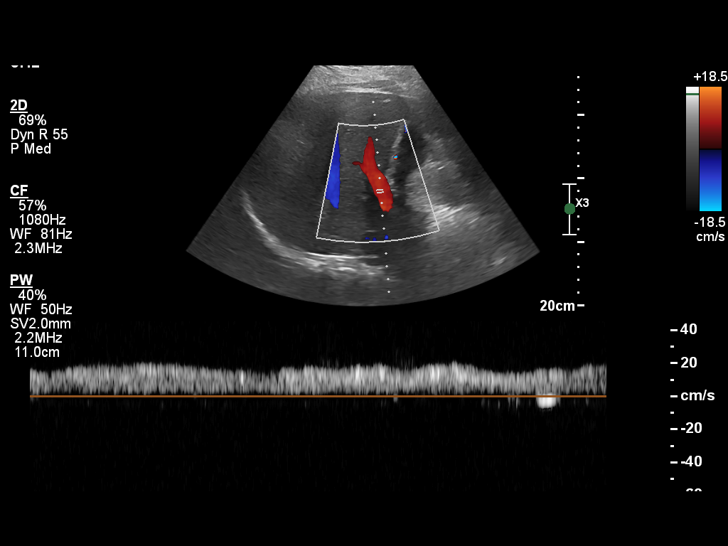
[im 40/73]
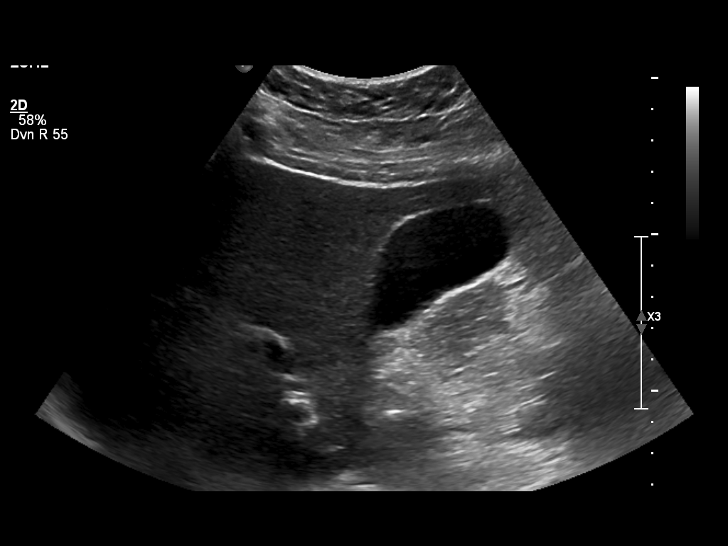
[im 46/73]
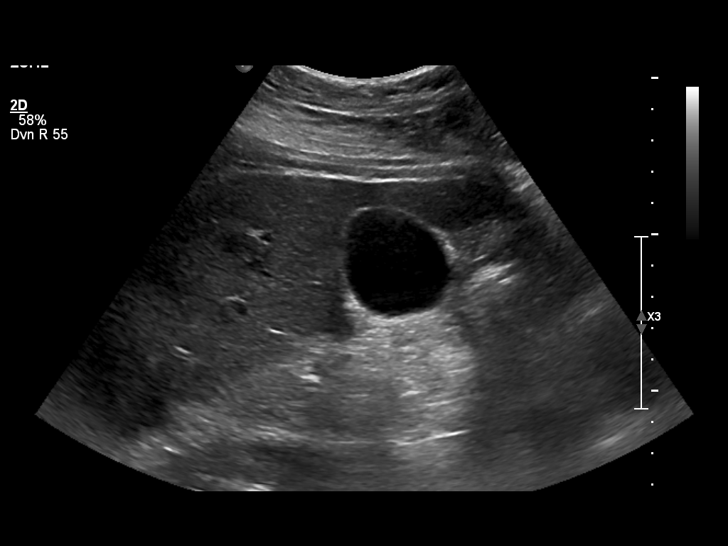
[im 49/73]
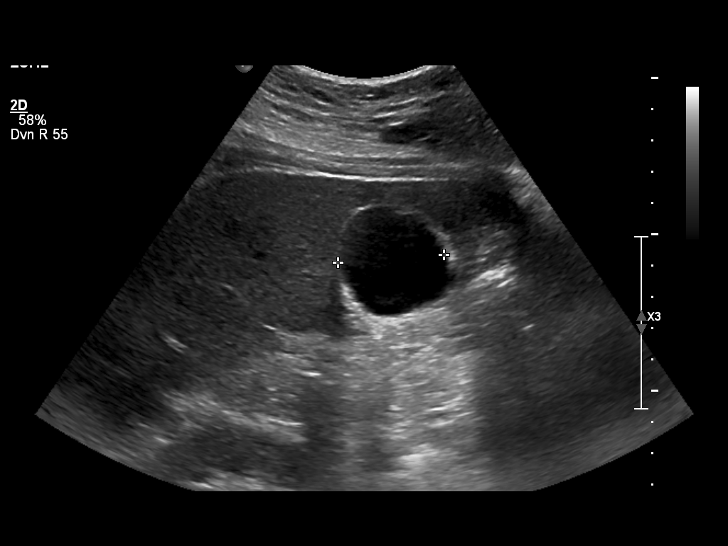
[im 55/73]
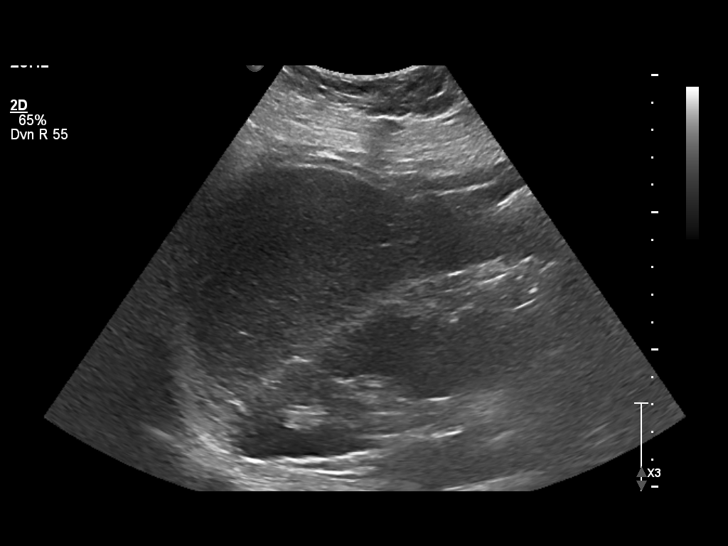
[im 61/73]
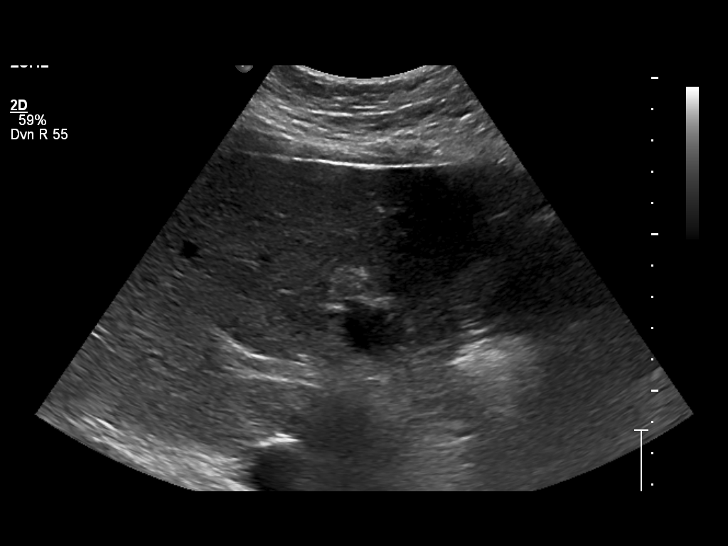
[im 67/73]
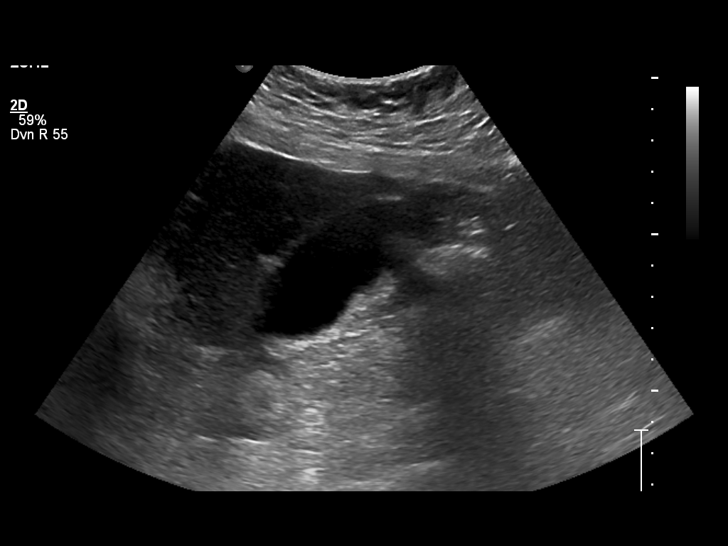
[im 73/73]
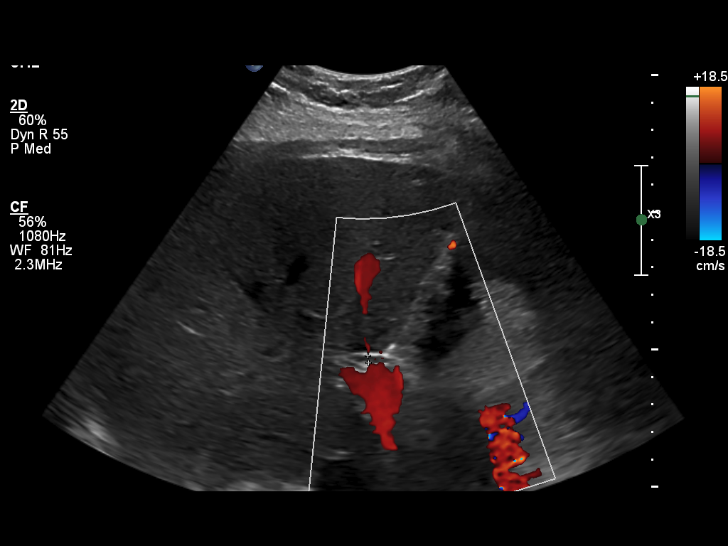

[14 of 25 positions shown; findings below may reference images not displayed]

DIAGNOSTIC STUDIES

EXAM

Abdominal ultrasound

INDICATION

postprandial abdominal bloating
postprandial abd bloating

TECHNIQUE

Limited abdominal ultrasound was obtained.

COMPARISONS

None available

FINDINGS

Visualized aorta and pancreas are within normal limits.

Liver is unremarkable. Common bile duct measures 2.6 millimeters.

Gallbladder is normal. No stones are seen.

Right kidney measures 11.2 cm length. No hydronephrosis.

IMPRESSION

Negative limited abdominal ultrasound.

Tech Notes:

postprandial abd bloating

## 2021-06-15 ENCOUNTER — Encounter: Admit: 2021-06-15 | Discharge: 2021-06-15 | Payer: BC Managed Care – PPO

## 2021-06-15 ENCOUNTER — Ambulatory Visit: Admit: 2021-06-15 | Discharge: 2021-06-15 | Payer: BC Managed Care – PPO

## 2021-06-15 DIAGNOSIS — G932 Benign intracranial hypertension: Secondary | ICD-10-CM

## 2021-06-15 DIAGNOSIS — H547 Unspecified visual loss: Secondary | ICD-10-CM

## 2021-06-15 DIAGNOSIS — R519 Generalized headaches: Secondary | ICD-10-CM

## 2021-06-15 DIAGNOSIS — R1319 Other dysphagia: Secondary | ICD-10-CM

## 2021-06-15 DIAGNOSIS — M1990 Unspecified osteoarthritis, unspecified site: Secondary | ICD-10-CM

## 2021-06-15 DIAGNOSIS — G43909 Migraine, unspecified, not intractable, without status migrainosus: Secondary | ICD-10-CM

## 2021-06-15 DIAGNOSIS — I10 Essential (primary) hypertension: Secondary | ICD-10-CM

## 2021-06-15 DIAGNOSIS — F32A Depression: Secondary | ICD-10-CM

## 2021-06-15 DIAGNOSIS — R413 Other amnesia: Secondary | ICD-10-CM

## 2021-06-15 NOTE — Progress Notes
Date of Service: 06/15/2021    Subjective:             Hannah Stewart is a 55 y.o. female here for follow up due to headaches and IIH.     History of Present Illness  Hannah Stewart is a 55 y.o. female with a past medical history of depression, back pain, HTN, bilateral CTS s/p surgery and tobacco use, here for follow up due to chronic headaches and IIH. Patient describes headaches for years, they are usually localized on left frontal area, pain described as squeezing/pressure sensation, headaches usually last for hours, they are associated with occasional nausea, she denies recent phonophobia/photophobia; aura or visual/autonomic symptoms. Patient denies recent visual problems or episodes of vision loss. Weather changes and stress may trigger headaches but she denies new triggers lately. Frequency has been 3-4 episodes a week, severe episodes may happen 1-2 per week and severity could be up to 5/10. Headaches are overall less frequent/intense lately. Patient also reports hand paresthesias but denies recent falls. LP was high OP (37) in the past. HbA1c was also slightly high, patient taking medication lately (Actos). She also tried to lose weight in the past few months. Patient reports persistent sleep problems. Recent labs were unremarkable, but thyroid function and cholesterol were abnormal. Patient was started on medications for both conditions.      Medications:    Preventive:  Diamox 750mg  bid (reports benefit with headaches, denies side effects lately)  Duloxetine 60mg  qday (reports benefit with depression but not with headaches, denies side effects)   Tried Topiramate (denies benefit with headaches)     Abortive:  Tylenol with some benefit (taking a few tablets per day), denies taking nausea meds, but she takes Benadryl with some benefit for sleep.   Diclofenac (reports some benefit for back pain).   Tried meclizine, but denies benefit.         Review of Systems   All other systems reviewed and are negative.      Medical History:   Diagnosis Date   ? Arthritis    ? Depression    ? Essential hypertension, benign    ? Generalized headaches    ? Memory loss    ? Migraines    ? Other dysphagia    ? Vision decreased        Surgical History:   Procedure Laterality Date   ? TONSILLECTOMY  1975   ? ANKLE SURGERY  1989   ? ANKLE ARTHROSCOPY Left 1989   ? TUBAL LIGATION  1999   ? HYSTERECTOMY  2003   ? CARPAL TUNNEL RELEASE Bilateral 2013   ? HERNIA REPAIR  05/2019       Social History     Socioeconomic History   ? Marital status: Married   Tobacco Use   ? Smoking status: Every Day     Packs/day: 1.00     Types: Cigarettes   ? Smokeless tobacco: Never   Substance and Sexual Activity   ? Alcohol use: Not Currently   ? Drug use: Not Currently   ? Sexual activity: Yes     Partners: Male     Birth control/protection: Surgical       Family History   Problem Relation Age of Onset   ? Hypertension Mother    ? Hypertension Father    ? Cataract Father    ? Amblyopia Neg Hx    ? Blindness Neg Hx    ? Glaucoma Neg Hx    ?  Macular Degen Neg Hx    ? Retinal Detachment Neg Hx    ? Strabismus Neg Hx        ALLERGIES  No Known Allergies    Objective:         ? acetaZOLAMIDE (DIAMOX) 250 mg tablet TAKE 3 TABLETS BY MOUTH TWICE DAILY   ? ALPRAZolam (XANAX) 0.25 mg tablet Take 0.25 mg by mouth as Needed.     ? atorvastatin (LIPITOR) 20 mg tablet Take 20 mg by mouth at bedtime daily.   ? busPIRone (BUSPAR) 10 mg tablet Take 10 mg by mouth twice daily.   ? diclofenac potassium (CATAFLAM) 50 mg tablet Take 50 mg by mouth daily.   ? diphenhydrAMINE (BENADRYL) 25 mg capsule Take 25 mg by mouth every 6 hours as needed.   ? duloxetine DR (CYMBALTA) 60 mg capsule    ? levothyroxine (SYNTHROID) 25 mcg tablet Take 25 mcg by mouth daily.   ? losartan-hydroCHLOROthiazide (HYZAAR) 100-12.5 mg tablet Take 1 tablet by mouth daily.   ? MELATONIN PO Take 10 mg by mouth daily.   ? omeprazole DR (PRILOSEC) 40 mg capsule Take 40 mg by mouth daily.   ? potassium chloride SR (K-DUR) 20 mEq tablet Take 20 mEq by mouth daily.     Vitals:    06/15/21 0749   BP: 127/81   BP Source: Arm, Left Upper   Pulse: 73   PainSc: Four   Weight: 105.9 kg (233 lb 6.4 oz)   Height: 167.6 cm (5' 6)     Body mass index is 37.67 kg/m?Marland Kitchen     Physical Exam  GENERAL APPEARANCE: The patient is alert. Patient is in no acute distress, following commands and cooperative. Well developed and well nourished.   HEENT: Normocephalic and atraumatic.   EXTREMITIES: no leg swelling.    NEUROLOGIC EXAM:   Orientation: The patient is alert and oriented times three. Patient is following commands. Speech is fluent, intact comprehension, repetition and naming.     Cranial nerves:  1st cranial nerve:  not tested   2nd cranial nerve: normal; Visual fields are full to confrontation. Pupils are equal, round, and reactive to light and accommodation. Non mydriatic funduscopic with no clear papilledema.   3rd, 4th & 6th cranial nerves: Extraocular movements are intact without nystagmus.  5th cranial nerve: normal; intact muscles of mastication. Intact light touch and pin prick.  7th cranial nerve: normal; no facial asymmetry  8th cranial nerve: normal  9th & 10th cranial nerves: normal; Gag is present   11th cranial nerve: normal; Shoulder shrug symmetric   12th cranial nerve: normal; tongue is midline.      Strength: (Right/Left) Deltoid 5/5, Biceps 5/5, Triceps 5/5, Finger ext 5/5, interossei 5/5, Hip Flexion 5/5, Knee ext 5/5, Knee flex 5/5, Ankle dorsiflexion 5/5, Ankle plantarflexion 5/5. Normal bulk and tone in all four limbs without any evidence of an arm drift.  No abnormal movements during exam.  Sensory: Intact to pain, temperature and vibration in both upper/lower extremities.   Coordination is intact finger-to-nose and rapidly alternating movements.    Deep tendon reflexes are 2+ in biceps, triceps, brachioradialis and patellar bilaterally and 1+ ankle reflex.  Hoffman sign is absent bilaterally. Clonus was not elicited.   Gait is normal based without ataxia. Fair tandem walking.    Assessment and Plan:  IIH. Patient thinks that her headaches have been slightly better in the last few months, they are not as frequent/intense lately, but she reports stress  and weather changes may trigger some episodes. She has noticed benefit and good tolerance with current Diamox dose. Patient also reports bilateral hand paresthesias and chronic insomnia but denies balance problems, recent visual changes or falls. She was recently seen by outside eye doctor with no significant abnormalities seen on visual field exam, papilledema was less prominent per report. Neurological exam seems otherwise stable today. We will keep similar amount of Diamox since symptoms and exam findings are stable, patient was advised to try to lose weight and follow up with ophthalmology clinic.     Recommendations:  1. We will keep Diamox 750mg  bid for IIH.   2. We will also use a limited amount of NSAIDs as abortive medications for headaches.   3. Patient advised to follow up with eye clinic (neuro-ophthalmology) and she may also consider sleep clinic referral in the future.   4. Follow-up appointment in 6 months.     Total time was 30 minutes. This time was spent preparing to see the patient, obtaining and/or reviewing history, performing an examination, ordering medications, tests/ procedures, communicating results to the patient, documenting clinical information in the electronic health record and counseling/educating the patient regarding headaches.

## 2021-09-03 ENCOUNTER — Encounter: Admit: 2021-09-03 | Discharge: 2021-09-03 | Payer: BC Managed Care – PPO

## 2021-09-03 MED ORDER — ACETAZOLAMIDE 250 MG PO TAB
ORAL_TABLET | 0 refills
Start: 2021-09-03 — End: ?

## 2021-09-19 ENCOUNTER — Encounter: Admit: 2021-09-19 | Discharge: 2021-09-19 | Payer: BC Managed Care – PPO

## 2021-09-20 ENCOUNTER — Encounter: Admit: 2021-09-20 | Discharge: 2021-09-20 | Payer: BC Managed Care – PPO

## 2021-09-20 ENCOUNTER — Ambulatory Visit: Admit: 2021-09-20 | Discharge: 2021-09-20 | Payer: BC Managed Care – PPO

## 2021-09-20 DIAGNOSIS — I10 Essential (primary) hypertension: Secondary | ICD-10-CM

## 2021-09-20 DIAGNOSIS — R413 Other amnesia: Secondary | ICD-10-CM

## 2021-09-20 DIAGNOSIS — M1990 Unspecified osteoarthritis, unspecified site: Secondary | ICD-10-CM

## 2021-09-20 DIAGNOSIS — G43909 Migraine, unspecified, not intractable, without status migrainosus: Secondary | ICD-10-CM

## 2021-09-20 DIAGNOSIS — R519 Generalized headaches: Secondary | ICD-10-CM

## 2021-09-20 DIAGNOSIS — F32A Depression: Secondary | ICD-10-CM

## 2021-09-20 DIAGNOSIS — H547 Unspecified visual loss: Secondary | ICD-10-CM

## 2021-09-20 DIAGNOSIS — G932 Benign intracranial hypertension: Secondary | ICD-10-CM

## 2021-09-20 DIAGNOSIS — R1319 Other dysphagia: Secondary | ICD-10-CM

## 2021-09-20 NOTE — Progress Notes
Body mass index is 34.14 kg/m?.      OCT OPTIC NERVE        Optic Nerve  Right Eye  Optic nerve findings: Normal Progression has been stable.     Left Eye  Optic nerve findings: Normal Progression has been stable.     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       VISUAL FIELD, EXTEND        [facility] Automated exam type was used.     Right Eye  Threshold was 24-2. Strategy was SITA-FAST. Reliability was good. Normal     Left Eye  Threshold was 24-2. Strategy was SITA-FAST. Reliability was borderline. Enlarged Blind Spot               Assessment and Plan:  03/29/20  This nice lady with history of chronic headache otherwise endorses none of the typical findings associated with intracranial hypertension--no obscurations, no pulsatile tinnitus, no binocular diplopia.  Her exam today is not dissimilar, demonstrating essentially normal visual field without peripheral field loss or enlarged blind spots.  Visual acuity, color vision, and pupils were normal OU.  Anterior segment was unremarkable.  Fundus exam showed small, cupless optic nerves without edema but also without spontaneous venous pulsations.  ?  In summary, there is no clear papilledema but also no spontaneous venous pulsations confirming the ICP is normal.  We agree with proceeding with LP with measurement of the opening pressure.  ?  ADDENDUM:  LP on 03/29/20 revealed opening pressure of 37 cm water.  Dr. Trish Mage has been treating her headache with Diamox, last seen on 06/15/21 when he increased Diamox to 750 mg BID.    09/20/21 visit  Patient returns for f/up visit today on 1500 mg Diamox daily.  She does report headache but no diplopia, transient obscurations, or pulsatile tinnitus.    Her exam is unchanged with 20/20 acuity OU, normal pupils without APD, normal visual fields, normal muscle balance and motility, normal optic nerves with spontaneous venous pulsations today implying normal ICP, but slight cataracts and dermatochalasis, neither of which are visually significant.    In summary she has no ophthalmologic symptoms or findings of intracranial hypertension.  We have suggested she be followed locally with her eye doctor unless she develops new or worsening symptoms.

## 2021-12-23 ENCOUNTER — Encounter: Admit: 2021-12-23 | Discharge: 2021-12-23 | Payer: BC Managed Care – PPO

## 2021-12-23 MED ORDER — ACETAZOLAMIDE 250 MG PO TAB
ORAL_TABLET | 0 refills
Start: 2021-12-23 — End: ?

## 2021-12-25 ENCOUNTER — Encounter: Admit: 2021-12-25 | Discharge: 2021-12-25 | Payer: BC Managed Care – PPO

## 2022-03-08 ENCOUNTER — Encounter: Admit: 2022-03-08 | Discharge: 2022-03-08 | Payer: BC Managed Care – PPO

## 2022-03-08 ENCOUNTER — Ambulatory Visit: Admit: 2022-03-08 | Discharge: 2022-03-08 | Payer: BC Managed Care – PPO

## 2022-03-08 DIAGNOSIS — R1319 Other dysphagia: Secondary | ICD-10-CM

## 2022-03-08 DIAGNOSIS — R413 Other amnesia: Secondary | ICD-10-CM

## 2022-03-08 DIAGNOSIS — F32A Depression: Secondary | ICD-10-CM

## 2022-03-08 DIAGNOSIS — R519 Generalized headaches: Secondary | ICD-10-CM

## 2022-03-08 DIAGNOSIS — G43719 Chronic migraine without aura, intractable, without status migrainosus: Secondary | ICD-10-CM

## 2022-03-08 DIAGNOSIS — H547 Unspecified visual loss: Secondary | ICD-10-CM

## 2022-03-08 DIAGNOSIS — I10 Essential (primary) hypertension: Secondary | ICD-10-CM

## 2022-03-08 DIAGNOSIS — G43909 Migraine, unspecified, not intractable, without status migrainosus: Secondary | ICD-10-CM

## 2022-03-08 DIAGNOSIS — G932 Benign intracranial hypertension: Secondary | ICD-10-CM

## 2022-03-08 DIAGNOSIS — M1990 Unspecified osteoarthritis, unspecified site: Secondary | ICD-10-CM

## 2022-03-08 MED ORDER — ACETAZOLAMIDE 250 MG PO TAB
750 mg | ORAL_TABLET | Freq: Two times a day (BID) | ORAL | 6 refills | 12.50000 days | Status: AC
Start: 2022-03-08 — End: ?

## 2022-03-13 ENCOUNTER — Encounter: Admit: 2022-03-13 | Discharge: 2022-03-13 | Payer: BC Managed Care – PPO

## 2022-03-13 DIAGNOSIS — G43719 Chronic migraine without aura, intractable, without status migrainosus: Secondary | ICD-10-CM

## 2022-03-13 LAB — CBC AND DIFF

## 2022-04-23 ENCOUNTER — Encounter: Admit: 2022-04-23 | Discharge: 2022-04-23 | Payer: BC Managed Care – PPO

## 2022-04-23 ENCOUNTER — Ambulatory Visit: Admit: 2022-04-23 | Discharge: 2022-04-24 | Payer: BC Managed Care – PPO

## 2022-04-23 DIAGNOSIS — Z720 Tobacco use: Secondary | ICD-10-CM

## 2022-04-23 DIAGNOSIS — F419 Anxiety disorder, unspecified: Secondary | ICD-10-CM

## 2022-04-23 DIAGNOSIS — E7800 Pure hypercholesterolemia, unspecified: Secondary | ICD-10-CM

## 2022-04-23 DIAGNOSIS — E039 Hypothyroidism, unspecified: Secondary | ICD-10-CM

## 2022-04-23 DIAGNOSIS — J302 Other seasonal allergic rhinitis: Secondary | ICD-10-CM

## 2022-04-23 DIAGNOSIS — R42 Dizziness and giddiness: Secondary | ICD-10-CM

## 2022-04-23 DIAGNOSIS — M1990 Unspecified osteoarthritis, unspecified site: Secondary | ICD-10-CM

## 2022-04-23 DIAGNOSIS — I10 Essential (primary) hypertension: Secondary | ICD-10-CM

## 2022-04-23 DIAGNOSIS — H547 Unspecified visual loss: Secondary | ICD-10-CM

## 2022-04-23 DIAGNOSIS — R413 Other amnesia: Secondary | ICD-10-CM

## 2022-04-23 DIAGNOSIS — F32A Depression: Secondary | ICD-10-CM

## 2022-04-23 DIAGNOSIS — G43909 Migraine, unspecified, not intractable, without status migrainosus: Secondary | ICD-10-CM

## 2022-04-23 DIAGNOSIS — R1319 Other dysphagia: Secondary | ICD-10-CM

## 2022-04-23 DIAGNOSIS — R519 Generalized headaches: Secondary | ICD-10-CM

## 2022-07-24 ENCOUNTER — Encounter: Admit: 2022-07-24 | Discharge: 2022-07-24 | Payer: BC Managed Care – PPO

## 2022-10-17 ENCOUNTER — Encounter: Admit: 2022-10-17 | Discharge: 2022-10-17 | Payer: BC Managed Care – PPO

## 2022-10-17 MED ORDER — ACETAZOLAMIDE 250 MG PO TAB
750 mg | ORAL_TABLET | Freq: Two times a day (BID) | ORAL | 0 refills | 12.50000 days | Status: AC
Start: 2022-10-17 — End: ?

## 2022-10-17 NOTE — Telephone Encounter
Received refill request for acetazolamide. Last OV 03/08/2022, medication included in plan of care. Next OV scheduled for 03/12/2023. Rx cannot be delegated, Dr. Guadelupe Sabin to co-sign.     Orvilla Fus, RN

## 2022-11-28 ENCOUNTER — Encounter: Admit: 2022-11-28 | Discharge: 2022-11-28 | Payer: BC Managed Care – PPO

## 2022-11-28 MED ORDER — ACETAZOLAMIDE 250 MG PO TAB
750 mg | ORAL_TABLET | Freq: Two times a day (BID) | ORAL | 0 refills | 12.50000 days | Status: AC
Start: 2022-11-28 — End: ?

## 2022-11-28 NOTE — Telephone Encounter
Received refill request for diamox. Last OV 03/08/2022, medication included in plan of care. Next OV scheduled for 03/12/2023. Rx cannot be delegated, routing to Dr. Arrie Aran for approval.     Orvilla Fus, RN

## 2023-01-13 ENCOUNTER — Encounter: Admit: 2023-01-13 | Discharge: 2023-01-13 | Payer: BC Managed Care – PPO

## 2023-01-13 MED ORDER — ACETAZOLAMIDE 250 MG PO TAB
750 mg | ORAL_TABLET | Freq: Two times a day (BID) | ORAL | 0 refills
Start: 2023-01-13 — End: ?

## 2023-02-14 ENCOUNTER — Encounter: Admit: 2023-02-14 | Discharge: 2023-02-14 | Payer: BC Managed Care – PPO

## 2023-02-14 MED ORDER — ACETAZOLAMIDE 250 MG PO TAB
750 mg | ORAL_TABLET | Freq: Two times a day (BID) | ORAL | 0 refills | 12.50000 days | Status: AC
Start: 2023-02-14 — End: ?

## 2023-02-14 NOTE — Telephone Encounter
Received refill request for Diamox. Last fill date 01/14/2023. Last OV 03/08/2022, medication included in plan of care. Next OV scheduled for 03/12/2023. Rx cannot be delegated, routing to Dr. Arrie Aran for approval.    Orvilla Fus, RN

## 2023-03-11 ENCOUNTER — Encounter: Admit: 2023-03-11 | Discharge: 2023-03-11 | Payer: BC Managed Care – PPO

## 2023-03-11 MED ORDER — ACETAZOLAMIDE 250 MG PO TAB
750 mg | ORAL_TABLET | Freq: Two times a day (BID) | ORAL | 0 refills | 12.50000 days | Status: AC
Start: 2023-03-11 — End: ?

## 2023-03-11 NOTE — Telephone Encounter
Received refill request for Diamox. Last fill date 02/14/2023. Last OV 03/08/2022, medication included in plan of care. Next OV scheduled for 03/12/2023. Rx cannot be delegated, routing to provider for approval.    Orvilla Fus, RN

## 2023-03-12 ENCOUNTER — Encounter: Admit: 2023-03-12 | Discharge: 2023-03-12 | Payer: BC Managed Care – PPO

## 2023-03-12 ENCOUNTER — Ambulatory Visit: Admit: 2023-03-12 | Discharge: 2023-03-12 | Payer: BC Managed Care – PPO

## 2023-03-12 DIAGNOSIS — E039 Hypothyroidism, unspecified: Secondary | ICD-10-CM

## 2023-03-12 DIAGNOSIS — H547 Unspecified visual loss: Secondary | ICD-10-CM

## 2023-03-12 DIAGNOSIS — G43719 Chronic migraine without aura, intractable, without status migrainosus: Secondary | ICD-10-CM

## 2023-03-12 DIAGNOSIS — G4700 Insomnia, unspecified: Secondary | ICD-10-CM

## 2023-03-12 DIAGNOSIS — A64 Unspecified sexually transmitted disease: Secondary | ICD-10-CM

## 2023-03-12 DIAGNOSIS — G479 Sleep disorder, unspecified: Secondary | ICD-10-CM

## 2023-03-12 DIAGNOSIS — J302 Other seasonal allergic rhinitis: Secondary | ICD-10-CM

## 2023-03-12 DIAGNOSIS — F419 Anxiety disorder, unspecified: Secondary | ICD-10-CM

## 2023-03-12 DIAGNOSIS — R413 Other amnesia: Secondary | ICD-10-CM

## 2023-03-12 DIAGNOSIS — R42 Dizziness and giddiness: Secondary | ICD-10-CM

## 2023-03-12 DIAGNOSIS — Z720 Tobacco use: Secondary | ICD-10-CM

## 2023-03-12 DIAGNOSIS — F32A Depression: Secondary | ICD-10-CM

## 2023-03-12 DIAGNOSIS — M1990 Unspecified osteoarthritis, unspecified site: Secondary | ICD-10-CM

## 2023-03-12 DIAGNOSIS — G43909 Migraine, unspecified, not intractable, without status migrainosus: Secondary | ICD-10-CM

## 2023-03-12 DIAGNOSIS — I10 Essential (primary) hypertension: Secondary | ICD-10-CM

## 2023-03-12 DIAGNOSIS — E7800 Pure hypercholesterolemia, unspecified: Secondary | ICD-10-CM

## 2023-03-12 DIAGNOSIS — R519 Generalized headaches: Secondary | ICD-10-CM

## 2023-03-12 DIAGNOSIS — R1319 Other dysphagia: Secondary | ICD-10-CM

## 2023-03-12 MED ORDER — ACETAZOLAMIDE 250 MG PO TAB
750 mg | ORAL_TABLET | Freq: Two times a day (BID) | ORAL | 3 refills | 12.50000 days | Status: AC
Start: 2023-03-12 — End: ?

## 2023-03-31 ENCOUNTER — Encounter: Admit: 2023-03-31 | Discharge: 2023-03-31 | Payer: BC Managed Care – PPO

## 2023-04-17 ENCOUNTER — Encounter: Admit: 2023-04-17 | Discharge: 2023-04-17 | Payer: BC Managed Care – PPO

## 2023-04-17 NOTE — Telephone Encounter
REASON FOR VISIT:   WITNESSED APNEA FROM PARTNER, DISRUPTED SLEEP, DAYTIMES FATIGUE, HEADACHES, HISTORY OF IIH.      PREVIOUS SLEEP STUDY:  NO     DOES TAKE MELATONIN @ 10 MG.

## 2023-04-24 ENCOUNTER — Ambulatory Visit: Admit: 2023-04-24 | Discharge: 2023-04-25 | Payer: BC Managed Care – PPO

## 2023-04-24 ENCOUNTER — Encounter: Admit: 2023-04-24 | Discharge: 2023-04-24 | Payer: BC Managed Care – PPO

## 2023-04-24 DIAGNOSIS — F419 Anxiety disorder, unspecified: Secondary | ICD-10-CM

## 2023-04-24 DIAGNOSIS — R1319 Other dysphagia: Secondary | ICD-10-CM

## 2023-04-24 DIAGNOSIS — G259 Extrapyramidal and movement disorder, unspecified: Secondary | ICD-10-CM

## 2023-04-24 DIAGNOSIS — G4752 REM sleep behavior disorder: Secondary | ICD-10-CM

## 2023-04-24 DIAGNOSIS — J302 Other seasonal allergic rhinitis: Secondary | ICD-10-CM

## 2023-04-24 DIAGNOSIS — H547 Unspecified visual loss: Secondary | ICD-10-CM

## 2023-04-24 DIAGNOSIS — Z720 Tobacco use: Secondary | ICD-10-CM

## 2023-04-24 DIAGNOSIS — R519 Generalized headaches: Secondary | ICD-10-CM

## 2023-04-24 DIAGNOSIS — R413 Other amnesia: Secondary | ICD-10-CM

## 2023-04-24 DIAGNOSIS — F32A Depression: Secondary | ICD-10-CM

## 2023-04-24 DIAGNOSIS — M1990 Unspecified osteoarthritis, unspecified site: Secondary | ICD-10-CM

## 2023-04-24 DIAGNOSIS — G479 Sleep disorder, unspecified: Secondary | ICD-10-CM

## 2023-04-24 DIAGNOSIS — R42 Dizziness and giddiness: Secondary | ICD-10-CM

## 2023-04-24 DIAGNOSIS — E039 Hypothyroidism, unspecified: Secondary | ICD-10-CM

## 2023-04-24 DIAGNOSIS — A64 Unspecified sexually transmitted disease: Secondary | ICD-10-CM

## 2023-04-24 DIAGNOSIS — I10 Essential (primary) hypertension: Secondary | ICD-10-CM

## 2023-04-24 DIAGNOSIS — E7800 Pure hypercholesterolemia, unspecified: Secondary | ICD-10-CM

## 2023-04-24 DIAGNOSIS — G43909 Migraine, unspecified, not intractable, without status migrainosus: Secondary | ICD-10-CM

## 2023-04-24 NOTE — Assessment & Plan Note
I counseled the patient extensively on maintaining a safe sleep environment for the patient and bed partner. I recommend keeping any sharp objects outside of the bedroom, any weapons locked in safe, padding corners of furniture, covering glass windows or doors with curtains, keeping bed low to ground, considering bed alarm sensors or on bedroom door, and consider sleeping in separate bedroom from partner. The patient understands and agrees to pursue safety precautions. 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

## 2023-04-25 ENCOUNTER — Encounter: Admit: 2023-04-25 | Discharge: 2023-04-25 | Payer: BC Managed Care – PPO

## 2023-04-25 DIAGNOSIS — G259 Extrapyramidal and movement disorder, unspecified: Secondary | ICD-10-CM

## 2023-04-25 DIAGNOSIS — R29818 Other symptoms and signs involving the nervous system: Secondary | ICD-10-CM

## 2023-04-25 DIAGNOSIS — G4700 Insomnia, unspecified: Secondary | ICD-10-CM

## 2023-04-25 LAB — IRON + BINDING CAPACITY + %SAT+ FERRITIN
% SATURATION: 21 (ref 20–55)
FERRITIN: 15 (ref 4.63–204.00)
IRON BINDING: 346 (ref 250–450)
IRON: 74 (ref 50–170)

## 2023-06-03 ENCOUNTER — Encounter: Admit: 2023-06-03 | Discharge: 2023-06-03 | Payer: BC Managed Care – PPO

## 2023-08-09 ENCOUNTER — Encounter: Admit: 2023-08-09 | Discharge: 2023-08-09 | Payer: BC Managed Care – PPO

## 2023-09-06 ENCOUNTER — Encounter: Admit: 2023-09-06 | Discharge: 2023-09-06 | Payer: BC Managed Care – PPO

## 2023-09-06 DIAGNOSIS — R2689 Other abnormalities of gait and mobility: Secondary | ICD-10-CM

## 2023-09-06 DIAGNOSIS — G932 Benign intracranial hypertension: Secondary | ICD-10-CM

## 2023-09-06 NOTE — Telephone Encounter
 I ordered MRI brain, please make sure patient does not have MRI contraindications.

## 2023-09-19 ENCOUNTER — Ambulatory Visit: Admit: 2023-09-19 | Discharge: 2023-09-19 | Payer: BC Managed Care – PPO

## 2023-09-19 ENCOUNTER — Encounter: Admit: 2023-09-19 | Discharge: 2023-09-19 | Payer: BC Managed Care – PPO

## 2023-09-20 ENCOUNTER — Encounter: Admit: 2023-09-20 | Discharge: 2023-09-20 | Payer: BC Managed Care – PPO

## 2023-10-28 ENCOUNTER — Encounter: Admit: 2023-10-28 | Discharge: 2023-10-28 | Payer: BLUE CROSS/BLUE SHIELD

## 2023-10-30 ENCOUNTER — Ambulatory Visit: Admit: 2023-10-30 | Discharge: 2023-10-30 | Payer: BLUE CROSS/BLUE SHIELD

## 2023-10-30 ENCOUNTER — Encounter: Admit: 2023-10-30 | Discharge: 2023-10-30 | Payer: BLUE CROSS/BLUE SHIELD

## 2023-10-30 DIAGNOSIS — M48061 Spinal stenosis, lumbar region without neurogenic claudication: Secondary | ICD-10-CM

## 2023-10-30 MED ORDER — CYCLOBENZAPRINE 5 MG PO TAB
5 mg | ORAL_TABLET | Freq: Every evening | ORAL | 0 refills | 30.00000 days | Status: AC | PRN
Start: 2023-10-30 — End: ?

## 2023-10-30 MED ORDER — METHYLPREDNISOLONE 4 MG PO DSPK
ORAL_TABLET | ORAL | 0 refills | 6.00000 days | Status: AC
Start: 2023-10-30 — End: ?

## 2023-10-30 NOTE — Progress Notes
 [facility] HISTORY AND PHYSICAL    Chief Complaint:   Chief Complaint   Patient presents with    New Patient     back pain       Subjective     HISTORY OF PRESENT ILLNESS:   Hannah Stewart is a 58 y.o. female who  has a past medical history of ADHD (attention deficit hyperactivity disorder) (2024), Anxiety disorder, Arthritis, Constipation (2020), Degenerative disc disease, lumbar (2016), Depression (2000), Diverticulitis of colon (without mention of hemorrhage)(562.11) (2018), Dizziness, Essential hypertension, benign, Generalized headaches, High cholesterol, Hypothyroid, Joint pain (2016), Memory loss, Migraines, Nausea (10/2023), Other dysphagia, Seasonal allergic reaction, Sleep disorder (2020), Spinal stenosis (2020), Tobacco abuse, Varicose veins (2014), Venereal disease (HPV 2023), and Vision decreased. who presents for evaluation.Patient is presenting with a longstanding history of low back pain and bilateral leg pain.  She was previously a patient of Dr. Felipe Horton who had provided epidural steroid injections back in 2021.  She reports pain that starts in her back and radiates into bilateral buttocks down bilateral legs.  Mostly stops around the knees but on the left side it extends all the way down into her feet.  She has numbness and tingling going all the way down to her feet.  She reports pain is constant nature, sharp shooting, pins-and-needles.  This is worse when she is standing walking bending twisting, better with rest.  She feels like she is unable to perform activities of daily living, the pain is making her throw up, she is lost 6 pounds in the last week or so.  She has been undergoing chiropractic care for greater than a month, she has been doing a physician directed home exercise program working on core strengthening and stability without significant relief.  She is taking naproxen for pain control.  She is interested in next step of management.             Shekia Filan denies any recent fevers, chills, infection, antibiotics, bowel or bladder incontinence, saddle anesthesia, bleeding issues, or recent anticoagulant.     ROS:   A 10 point review of systems is negative except for what is noted in the HPI above.    Past Medical History:  Past Medical History:    ADHD (attention deficit hyperactivity disorder)    Anxiety disorder    Arthritis    Constipation    Degenerative disc disease, lumbar    Depression    Diverticulitis of colon (without mention of hemorrhage)(562.11)    Dizziness    Essential hypertension, benign    Generalized headaches    High cholesterol    Hypothyroid    Joint pain    Memory loss    Migraines    Nausea    Other dysphagia    Seasonal allergic reaction    Sleep disorder    Spinal stenosis    Tobacco abuse    Varicose veins    Venereal disease    Vision decreased       Family History:  Family History   Problem Relation Name Age of Onset    Hypertension Mother Reida Car     Hypertension Father Autry Legions patton     Cataract Father John patton     Amblyopia Neg Hx      Blindness Neg Hx      Glaucoma Neg Hx      Macular Degen Neg Hx      Retinal Detachment Neg Hx      Strabismus  Neg Hx      Alcohol liver disease Father Autry Legions patton     Joint Pain Mother Stana Ear patton     Neck Pain Brother John patton     Back pain Mother Stana Ear patton         degenerative disc disease       Social History:  [place]ives in [place] [address]    Social History     Socioeconomic History    Marital status: Married   Tobacco Use    Smoking status: Every Day     Current packs/day: 1.00     Average packs/day: 1 pack/day for 15.0 years (15.0 ttl pk-yrs)     Types: Cigarettes    Smokeless tobacco: Never   Substance and Sexual Activity    Alcohol use: Not Currently    Drug use: Not Currently     Frequency: 1.0 times per week     Types: Marijuana     Comment: Not in 25 years    Sexual activity: Yes     Partners: Male     Birth control/protection: Surgical       Allergies:  No Known Allergies    Medications:    Current Outpatient Medications:     acetaZOLAMIDE (DIAMOX) 250 mg tablet, Take three tablets by mouth twice daily., Disp: 540 tablet, Rfl: 3    ALPRAZolam (XANAX) 0.25 mg tablet, Take one tablet by mouth as Needed.  , Disp: , Rfl:     atorvastatin (LIPITOR) 20 mg tablet, Take one tablet by mouth at bedtime daily., Disp: , Rfl:     busPIRone (BUSPAR) 10 mg tablet, Take one tablet by mouth twice daily., Disp: , Rfl:     diclofenac potassium (CATAFLAM) 50 mg tablet, Take one tablet by mouth daily., Disp: , Rfl:     diphenhydrAMINE (BENADRYL) 25 mg capsule, Take one capsule by mouth every 6 hours as needed., Disp: , Rfl:     duloxetine DR (CYMBALTA) 30 mg capsule, Take one capsule by mouth daily., Disp: , Rfl:     duloxetine DR (CYMBALTA) 60 mg capsule, , Disp: , Rfl:     levothyroxine (SYNTHROID) 25 mcg tablet, Take one tablet by mouth daily., Disp: , Rfl:     losartan-hydroCHLOROthiazide (HYZAAR) 100-12.5 mg tablet, Take one tablet by mouth daily., Disp: , Rfl:     MELATONIN PO, Take 10 mg by mouth daily., Disp: , Rfl:     omeprazole DR (PRILOSEC) 40 mg capsule, Take one capsule by mouth daily., Disp: , Rfl:     OXcarbazepine (TRILEPTAL) 150 mg tablet, Take one tablet by mouth twice daily., Disp: , Rfl:     Physical examination:   There were no vitals taken for this visit.  Pain Score: Eight   Telehealth Patient Reported Vitals       Row Name 10/30/23 1351                Pain Score EIGHT                        Gen: Alert & Oriented X 3  HEENT: EOMI  Neck: Supple, no elevated JVP  Heart: Extremities well perfused  Lungs: non labored breathing  Abdomen: Soft, non-tender, non-distended  Skin: no gross lesions appreciated  Ext: purposeful movement of extremities     LOWER EXTREMITIES  MS:   Root Right Left   Hip Flexion L2 5 5   Knee Flexion L5/S1 5 5  Knee Extension L3 4 4   Dorsiflexion L4 5 4   Plantarflexion S1 5 5   EHL Extension L5 5 5     Gait was smooth and symmetric with equal arm swing.        pain reproduced with facet loading.    No tenderness to palpation along the spinous process, facet joints, paraspinal musculature, SI joints,   Tender palpation bilateral gluteal musculature, greater trochanters.      Patient is able to forward flex to knees, and able to extend without significant pain.    Full ROM bilateral lower extremities.      Positive slump testing.  Positive straight leg testing to 30-75 degrees.      Negative FADIR testing.  Negative Patrick-FABER testing.  Negative Fortin Finger Sign.   Negative Gaenslens testing.  Negative SI compression    Neuro:  DTR's 2+ right patella, 2+ left patella  2+ right achilles, 2+ left achilles   Lower Extremity Tone Normal   Lower Extremity Sensation Intact to light touch bilaterally     DIAGNOSTICS:  L-spine x-ray 10/30/2023:  1.  Moderate convex left upper lumbar curve.   2.  Severe multilevel spondylosis with facet arthritis and degenerative   disc disease. Relative sparing of the L5-S1 disc space.   3.  No significant listhesis or instability with flexion-extension.     Last Cr and LFT's:  Creatinine   Date Value Ref Range Status   09/07/2020 0.87 0.4 - 1.00 MG/DL Final            Assessment:  The pain complaints are most likely due to:  1. Neuroforaminal stenosis of lumbar spine    2. Lumbar radiculopathy      Raynie Rosenfeld is a 58 y.o. female who  has a past medical history of ADHD (attention deficit hyperactivity disorder) (2024), Anxiety disorder, Arthritis, Constipation (2020), Degenerative disc disease, lumbar (2016), Depression (2000), Diverticulitis of colon (without mention of hemorrhage)(562.11) (2018), Dizziness, Essential hypertension, benign, Generalized headaches, High cholesterol, Hypothyroid, Joint pain (2016), Memory loss, Migraines, Nausea (10/2023), Other dysphagia, Seasonal allergic reaction, Sleep disorder (2020), Spinal stenosis (2020), Tobacco abuse, Varicose veins (2014), Venereal disease (HPV 2023), and Vision decreased. who presents for evaluation of pain.    Plan:  1.  Lifestyle modification.  Continue current activities as tolerated.    2.  Medication.    - Medrol Dosepak for acute discogenic and radicular pain  -Flexeril 5 mg nightly as needed  3.  Therapy.    - Patient provided prescription for formalized physical therapy to work on lumbar stabilization exercises, soft tissue mobilization, stretching techniques, as well as modalities such as TENS unit, traction, therapeutic ultrasound, and Grasston technique  4.  Interventions.  No interventions planned at this time.  5.  Diagnostics.  Ordering MRI L-spine as she has failed conservative measures including greater than 6 weeks of conservative management including physical therapy, chiropractic care and medication.  6.  Follow-up.  Patient will follow-up after MRI to discuss interventional options.    Risks/benefits of all pharmacologic and interventional treatments discussed and questions answered.     Thank you for this kind referral for consultation. Please feel free to contact me with any questions or concerns.

## 2023-10-31 DIAGNOSIS — M5416 Radiculopathy, lumbar region: Secondary | ICD-10-CM

## 2023-11-15 ENCOUNTER — Encounter: Admit: 2023-11-15 | Discharge: 2023-11-15 | Payer: BLUE CROSS/BLUE SHIELD

## 2023-11-18 ENCOUNTER — Encounter: Admit: 2023-11-18 | Discharge: 2023-11-18 | Payer: BLUE CROSS/BLUE SHIELD

## 2023-11-20 ENCOUNTER — Encounter: Admit: 2023-11-20 | Discharge: 2023-11-20 | Payer: BLUE CROSS/BLUE SHIELD

## 2023-11-22 ENCOUNTER — Encounter: Admit: 2023-11-22 | Discharge: 2023-11-22 | Payer: BLUE CROSS/BLUE SHIELD

## 2023-11-26 ENCOUNTER — Encounter: Admit: 2023-11-26 | Discharge: 2023-11-26 | Payer: BLUE CROSS/BLUE SHIELD

## 2023-11-26 ENCOUNTER — Ambulatory Visit: Admit: 2023-11-26 | Discharge: 2023-11-27 | Payer: BLUE CROSS/BLUE SHIELD

## 2023-11-26 DIAGNOSIS — M5416 Radiculopathy, lumbar region: Secondary | ICD-10-CM

## 2023-11-26 NOTE — Progress Notes
 [facility] CLINIC NOTE       SUBJECTIVE: 58 year old female presenting in follow-up from last visit on 10/30/2023.  At that time she was diagnosed with lumbar stenosis and radiculopathy.  He was she was given a Medrol Dosepak and some Flexeril.  She was sent for formalized physical therapy will get a new MRI of her L-spine.  The MRI was done 11/11/2023 and showed severe multilevel degeneration scoliotic lumbar spine with multiple levels of neuroforaminal narrowing worse at L4-5 and L5-S1, left greater than right.  She is reporting continued pain in her back that radiates down bilateral legs.  She has more numbness and tingling on the left going to about mid calf on the right side there is pain and sensation changes going down to her ankle.  She reports the Medrol Dosepak and physical therapy slowly helping with the pain but she is still feeling a significant amount of pain.  She is interested in neck step of therapy.         Review of Systems    Current Outpatient Medications:     acetaZOLAMIDE (DIAMOX) 250 mg tablet, Take three tablets by mouth twice daily., Disp: 540 tablet, Rfl: 3    ALPRAZolam (XANAX) 0.25 mg tablet, Take one tablet by mouth as Needed.  , Disp: , Rfl:     atorvastatin (LIPITOR) 20 mg tablet, Take one tablet by mouth at bedtime daily., Disp: , Rfl:     busPIRone (BUSPAR) 10 mg tablet, Take one tablet by mouth twice daily., Disp: , Rfl:     cyclobenzaprine (FLEXERIL) 5 mg tablet, Take one tablet by mouth at bedtime as needed for Muscle Cramps., Disp: 90 tablet, Rfl: 0    diclofenac potassium (CATAFLAM) 50 mg tablet, Take one tablet by mouth daily., Disp: , Rfl:     diphenhydrAMINE (BENADRYL) 25 mg capsule, Take one capsule by mouth every 6 hours as needed., Disp: , Rfl:     duloxetine DR (CYMBALTA) 30 mg capsule, Take one capsule by mouth daily., Disp: , Rfl:     duloxetine DR (CYMBALTA) 60 mg capsule, , Disp: , Rfl:     levothyroxine (SYNTHROID) 25 mcg tablet, Take one tablet by mouth daily., Disp: , Rfl:     losartan-hydroCHLOROthiazide (HYZAAR) 100-12.5 mg tablet, Take one tablet by mouth daily., Disp: , Rfl:     MELATONIN PO, Take 10 mg by mouth daily., Disp: , Rfl:     methylPREDNIsolone (MEDROL (PAK)) 4 mg tablet, Take medication as directed on package for 6 days. Take with food., Disp: 21 tablet, Rfl: 0    omeprazole DR (PRILOSEC) 40 mg capsule, Take one capsule by mouth daily., Disp: , Rfl:     OXcarbazepine (TRILEPTAL) 150 mg tablet, Take one tablet by mouth twice daily., Disp: , Rfl:   No Known Allergies  Physical Exam  There were no vitals filed for this visit.     Oswestry Total Score:: (Patient-Rptd) 34     There is no height or weight on file to calculate BMI.  This visit was conducted by means of a two-way synchronous audio-video connection    General: 58 y.o. year old who appears stated age, in no acute distress  Behavior: cooperative, fully engaged, good eye contact  Speech: normal volume, rate and tone  Mood: Euthymic, no SI or HI verbalized at this time  Affect: Congruent to mood and the context of the discussion  Orientation: Alert and oriented  HEENT: Normocephalic, atraumatic  Resp: Unlabored respirations, no audible  cough or wheezing  Extremities: No cyanosis, clubbing, or edema  Skin: No visible rashes, wounds, or ecchymosis   Musculoskeletal: Full functional ROM, no gross deformities   Gait: Able to walk without assistive device, able to heel and toe walk  Neurologic: Sensation grossly intact per patient in C3-T1 dermatomes   Cranial nerves:  CN II - pupils equal, round    CN III, IV, VI - EOMI, no evidence of nystagmus, no ptosis, pupils are symmetric  CN VII - no facial droop/asymmetry, smile symmetric  CN VIII - hearing intact to normal voice  CN IX, X - no hoarseness/nasal voice  CN XII - tongue midline           IMPRESSION:  1. Lumbar radiculopathy          PLAN: Plan is to schedule an L4-5 interlaminar epidural steroid injection.  She was encouraged to continue formalized physical therapy in the meantime.    Total time 15 minutes.  Estimated counseling time 10 minutes.

## 2023-12-23 ENCOUNTER — Ambulatory Visit: Admit: 2023-12-23 | Discharge: 2023-12-23 | Payer: BLUE CROSS/BLUE SHIELD

## 2023-12-23 ENCOUNTER — Encounter: Admit: 2023-12-23 | Discharge: 2023-12-23 | Payer: BLUE CROSS/BLUE SHIELD

## 2023-12-23 DIAGNOSIS — M5416 Radiculopathy, lumbar region: Secondary | ICD-10-CM

## 2023-12-23 MED ORDER — IOHEXOL 300 MG IODINE/ML IV SOLN
1 mL | Freq: Once | 0 refills | Status: CP
Start: 2023-12-23 — End: ?

## 2023-12-23 MED ORDER — LIDOCAINE (PF) 10 MG/ML (1 %) IJ SOLN
2 mL | Freq: Once | INTRAMUSCULAR | 0 refills | Status: CP
Start: 2023-12-23 — End: ?

## 2023-12-23 MED ORDER — TRIAMCINOLONE ACETONIDE 40 MG/ML IJ SUSP
80 mg | Freq: Once | EPIDURAL | 0 refills | Status: CP
Start: 2023-12-23 — End: ?

## 2023-12-23 MED ORDER — SODIUM CHLORIDE 0.9 % IJ SOLN
3 mL | Freq: Once | INTRAMUSCULAR | 0 refills | Status: CP
Start: 2023-12-23 — End: ?

## 2023-12-23 NOTE — Progress Notes
 [facility]  INTERVENTIONAL PAIN PROCEDURE HISTORY AND PHYSICAL    No chief complaint on file.      HISTORY OF PRESENT ILLNESS:  Hannah Stewart is a 58 y.o. year old female who presents for injection.  Denies fevers, chills, or recent hospitalizations.  Patient denies currently taking blood thinning medications.       Past Medical History:    ADHD (attention deficit hyperactivity disorder)    Anxiety disorder    Arthritis    Constipation    Degenerative disc disease, lumbar    Depression    Diverticulitis of colon (without mention of hemorrhage)(562.11)    Dizziness    Essential hypertension, benign    Generalized headaches    High cholesterol    Hypothyroid    Joint pain    Memory loss    Migraines    Nausea    Other dysphagia    Seasonal allergic reaction    Sleep disorder    Spinal stenosis    Tobacco abuse    Unspecified deficiency anemia    Varicose veins    Venereal disease    Vision decreased       Surgical History:   Procedure Laterality Date    TONSILLECTOMY  1975    ANKLE SURGERY  1989    ANKLE ARTHROSCOPY Left 1989    TUBAL LIGATION  1999    HYSTERECTOMY  2003    CARPAL TUNNEL RELEASE Bilateral 2013    HERNIA REPAIR  05/2019    HX ADENOIDECTOMY  1974    HX HYSTERECTOMY  July 2002    HX TONSILLECTOMY  1975    PR LAPAROSCOPY SURG RPR INITIAL INGUINAL HERNIA  November 11,2020       family history includes Alcohol liver disease in her father; Back pain in her mother; Cataract in her father; Hypertension in her father and mother; Joint Pain in her mother; Neck Pain in her brother.    Social History     Socioeconomic History    Marital status: Married   Tobacco Use    Smoking status: Every Day     Current packs/day: 1.00     Average packs/day: 1 pack/day for 15.0 years (15.0 ttl pk-yrs)     Types: Cigarettes    Smokeless tobacco: Never   Vaping Use    Vaping status: Never Used   Substance and Sexual Activity    Alcohol use: Not Currently    Drug use: Not Currently     Frequency: 1.0 times per week Types: Marijuana     Comment: Not in 25 years    Sexual activity: Yes     Partners: Male     Birth control/protection: Surgical       No Known Allergies    There were no vitals filed for this visit.          REVIEW OF SYSTEMS: 10 point ROS obtained and negative except back pain      PHYSICAL EXAM:  General: 57 y.o. female appears stated age, in no acute distress  HEENT: Normocephalic, atraumatic  Neck: No thyroidmegaly  Cardiovascular: Well perfused  Pulmonary: Unlabored respirations  Extremities: No cyanosis, clubbing, or edema  Skin: No lesions seen on exposed skin  Psychiatric:  Appropriate mood and affect  Musculoskeletal: No atrophy.   Neurologic: Antigravity strength in all extremities. CN II -XII grossly intact.  Alert and oriented x 3.           IMPRESSION:    1. Lumbar  radiculopathy         PLAN: Lumbar Epidural Steroid Injection L4-5

## 2023-12-23 NOTE — Discharge Instructions - Supplementary Instructions
 GENERAL POST PROCEDURE INSTRUCTIONS  Physician: _________________________________  Procedure Completed Today:  Joint Injection (hip, knee, shoulder)  Cervical Epidural Steroid Injection  Cervical Transforaminal Steroid Injection  Trigger Point Injection  Caudal Epidural Steroid Injection  Piriformis Injection  Pudendal Nerve Block  Other _____________________ Thoracic Epidural Steroid Injection  Lumbar Epidural Steroid Injection  Lumbar Transforaminal Steroid Injection  Facet Joint Injection  Celiac Nerve Block  Sacrococcygeal  Sacroiliac Joint Injection   Important information following your procedure today:  You may drive today     If you had sedation, you may NOT drive today  Rest at home for the next 6 hours.  You may then begin to resume your normal activities.  DO NOT drive any vehicle, operate any power tools, drink alcohol, make any major decisions, or sign any legal documents for the next 12 hours.  Pain relief may not be immediate. It is possible you may even experience an increase in pain during the first 24-48 hours followed by a gradual decrease of your pain.  Though the procedure is generally safe, and complications are rare, we do ask that you be aware of any of the following:  Any swelling, persistent redness, new bleeding or drainage from the site of the injection.  You should not experience a severe headache.  You should not run a fever over 101oF.  New onset of sharp, severe back and or neck pain.  New onset of upper or lower extremity numbness or weakness.  New difficulty controlling bowel or bladder function after injection.  New shortness of breath.  ** If any of these occur, please call to report this occurrence to the nurse of Dr. Noralyn Pick at [phone]. If you are calling after 4:00 p.m. or on weekends or holidays, please call [phone] and ask to have the resident physician on call for the physician paged or go to your local emergency room.  You may experience soreness at the injection site. Ice can be applied at 20-minute intervals for the first 24 hours. The following day you may alternate ice with heat if you are experiencing muscle tightness, otherwise continue with ice. Ice works best at decreasing pain. Avoid application of direct heat, hot showers or hot tubs today.  Avoid strenuous activity today. You many resume your regular activities and exercise tomorrow.  Patients with diabetes may see an elevation in blood sugars for 7-10 days after the injection. It is important to pay close attention to your diet, check your blood sugars daily and report extreme elevations to the physician that manages your diabetes.  Patients taking daily blood thinners can resume their regular dose this evening.  It is important that you take all medications ordered by your pain physician. Taking medications as ordered is an important part of your pain care plan. If you cannot continue the medication plan, please notify the physician.    Possible side effects to steroids that may occur:  Flushing or redness of the face  Irritability  Fluid retention  Change in women's menses  Minor headache    If you are unable to keep your upcoming appointment, please notify the [facility] scheduler at [phone] at least 24 hours in advance. If you have questions for the surgery center, call [facility] at [phone].

## 2023-12-23 NOTE — Procedures
 Attending Surgeon: Ozell JAYSON Don, MD    Anesthesia: Local      Epidural Steroid Injection Lumbar/Caudal  Procedure: epidural - interlaminar    Laterality: n/a   on 12/23/2023 10:30 AM  Location: lumbar ESI with imaging - L4-5      Consent:   Consent obtained: verbal and written  Consent given by: patient  Risks discussed: bleeding, bruising, infection, weakness and no change or worsening in pain    Discussed with patient the purpose of the treatment/procedure, other ways of treating my condition, including no treatment/ procedure and the risks and benefits of the alternatives. Patient has decided to proceed with treatment/procedure.        Universal Protocol:  Relevant documents: relevant documents present and verified  Test results: test results available and properly labeled  Imaging studies: imaging studies available  Required items: required blood products, implants, devices, and special equipment available  Site marked: the operative site was marked  Patient identity confirmed: Patient identify confirmed verbally with patient.        Time out: Immediately prior to procedure a time out was called to verify the correct patient, procedure, equipment, support staff and site/side marked as required      Procedures Details:   Indications: pain   Prep: chlorhexidine  Number of Levels: 1  Approach: left paramedian  Needle size: 18 G  Patient tolerance: Patient tolerated the procedure well with no immediate complications. Pressure was applied, and hemostasis was accomplished.  Comments: DESCRIPTION OF PROCEDURE:  The procedure risks and benefits were explained to the patient and informed consent was obtained.  The patient was positioned prone on the fluoroscopy table with a pillow under the abdomen to help reduce lumbar lordosis.  Blood pressure cuff and oxygen saturation monitors were attached and the patient was monitored throughout the entire procedure.  The L4 vertebral level was identified with the use of fluoroscopy in the AP view.  The skin was prepped using Chlorhexadine and draped in aseptic fashion.  The skin and subcutaneous tissue were anesthetized using 3 mL of 1 percent lidocaine  with a 27-gauge needle.  A 3.5 inch 22-gauge Tuohy needle was slowly advanced using AP view towards the midline L4-L5 epidural space.  The latter part of the needle advancement was guided with fluoroscopy in the lateral view.  The epidural space was identified using loss of resistance technique.  After negative aspiration, 1 mL of Isovue  contrast dye was injected.  After epidural spread was seen, a solution containing 80 mg of triamcinolone  and 2 mL of normal saline was injected in increments.  The stylet was reinserted and the needle was then removed.     After the procedure, the patient's blood pressure, heart rate, oxygen saturation, and VAS were recorded in the chart. There were no complications.  The patient tolerated the procedure well and was brought to recovery room for observation in stable condition and discharged with written discharge instructions.     PLAN OF CARE:  The patient is to followup in 6 weeks.    The patient was advised to contact the [facility] for any of the following    1. Fever, chills, or night sweats.  2. New onset of severe sharp pain.  3. Any new upper or lower extremity weakness or numbness.  4. Any questions regarding the procedure.  This patient's clinical history, exam, AND imaging support radiculopathy AND there is a significant impact on quality of life and function AND the pain has been  present for at least 4 weeks AND they have failed to improve with noninvasive conservative care.        Estimated blood loss: none or minimal  Specimens: none  Patient tolerated the procedure well with no immediate complications. Pressure was applied, and hemostasis was accomplished.  Administrations This Visit       iohexoL  (OMNIPAQUE -300) 300 mg/mL injection 1 mL       Admin Date  12/23/2023 Action  Given Dose  1 mL Route  SEE ADMIN INSTRUCTIONS Documented By  Maranda Remak, RN              lidocaine  PF 1% (10 mg/mL) injection 2 mL       Admin Date  12/23/2023 Action  Given Dose  2 mL Route  Injection Documented By  Maranda Remak, RN              sodium chloride  PF 0.9% injection 3 mL       Admin Date  12/23/2023 Action  Given Dose  3 mL Route  Injection Documented By  Maranda Remak, RN              triamcinolone  acetonide (KENALOG -40) injection 80 mg       Admin Date  12/23/2023 Action  Given Dose  80 mg Route  Epidural Documented By  Maranda Remak, RN

## 2023-12-31 ENCOUNTER — Encounter: Admit: 2023-12-31 | Discharge: 2023-12-31 | Payer: BLUE CROSS/BLUE SHIELD

## 2023-12-31 DIAGNOSIS — G4734 Idiopathic sleep related nonobstructive alveolar hypoventilation: Secondary | ICD-10-CM

## 2023-12-31 DIAGNOSIS — G4733 Obstructive sleep apnea (adult) (pediatric): Principal | ICD-10-CM

## 2023-12-31 NOTE — Telephone Encounter
-----   Message from JINNY Man, MD sent at 12/27/2023  5:59 PM CDT -----  Regarding: Sleep study results  PSG showed mild OSA with hypoxemia. Hypoxemia was out of proportion to severity of OSA. Recommend facility-based CPAP titration study starting without supplemental oxygen.     Also recommend referral to Pulmonary for further evaluation of hypoxemia.     If patient is agreeable to APAP in the meantime, start APAP 6-20 cm H20 with all necessary supplies and include one night nocturnal oximetry monitoring.     Thank you!

## 2024-01-15 ENCOUNTER — Encounter: Admit: 2024-01-15 | Discharge: 2024-01-15 | Payer: BLUE CROSS/BLUE SHIELD

## 2024-01-16 ENCOUNTER — Encounter: Admit: 2024-01-16 | Discharge: 2024-01-16 | Payer: BLUE CROSS/BLUE SHIELD

## 2024-01-16 ENCOUNTER — Ambulatory Visit: Admit: 2024-01-16 | Discharge: 2024-01-17 | Payer: BLUE CROSS/BLUE SHIELD

## 2024-01-16 DIAGNOSIS — G4733 Obstructive sleep apnea (adult) (pediatric): Principal | ICD-10-CM

## 2024-01-16 NOTE — Patient Instructions
 Sleep Medicine nurse team:  Tacey Ruiz   [phone]  Neysa Bonito   [phone]    Fax: [phone]

## 2024-01-16 NOTE — Progress Notes
 Telehealth Visit Note  Date of Service: 01/16/2024    Subjective:             Hannah Stewart is a 58 y.o. female.    Past Medical History:    ADHD (attention deficit hyperactivity disorder)    Anxiety disorder    Arthritis    Constipation    Degenerative disc disease, lumbar    Depression    Diverticulitis of colon (without mention of hemorrhage)(562.11)    Dizziness    Essential hypertension, benign    Generalized headaches    High cholesterol    Hypothyroid    Joint pain    Memory loss    Migraines    Nausea    Other dysphagia    Seasonal allergic reaction    Sleep apnea    Sleep disorder    Spinal stenosis    Tobacco abuse    Unspecified deficiency anemia    Varicose veins    Venereal disease    Vision decreased         History of Present Illness  Chief Complaint   Patient presents with    Sleep Problem       Hannah Stewart presents today via telehealth for follow-up. She was last seen by Dr. Zenon on 04/24/23. At that time a sleep study was ordered.        PSG (11/15/23):  TST 401.5 minutes. Sleep efficiency 86.9%.   SOL 3.0 minutes. REM latency 344 minutes. WASO 57.5 minutes. AI 8.7.   AHI 4% 3.6. Supine AHI 6.7. Lateral AHI 2.8. REM AHI 3.5. NREM AHI 3.6.   AHI 3% 6.3. Supine AHI 10.4. Lateral AHI 5.2. REM AHI 12.2. NREM AHI 5.7.   Average SpO2 90.8%. Minimum SpO2 85%. Time spent < 88% 91.9 minutes.   PLMI 0/hr.   IMPRESSION:  Mild obstructive sleep apnea and hypoxemia. The hypoxemia was out of proportion to the severity of the patient's sleep disordered breathing. Of note, supplemental oxygen 1 LPM was added at 2:10 AM which may have masked additional hypopneas.   Sleep architecture showed a decreased sleep onset latency, increased REM latency, and decreased percentage of REM sleep.   Periodic limb movements of sleep (PLMs) were not present.  REM sleep without atonia was not present.      Recommendations:  In consideration of the above findings, the following recommendations are provided (these recommendations may not represent all possible clinical or therapeutic options):  Recommend a facility-based CPAP titration starting without supplemental oxygen to establish the efficacy of CPAP, to determine the optimal pressure, and to evaluate for resolution of hypoxemia with treatment of sleep apnea.   Referral to Pulmonary Medicine for evaluation of hypoxemia.   Weight loss should be pursued in the long-term management of this patient's sleep disordered breathing.   Avoid sedatives and alcohol.   The patient has been advised of the risks of operating a motor vehicle in the presence of excessive daytime sleepiness,  and to avoid driving until her sleepiness has been resolved.       Interval History:   Things have been about the same since last visit.   Started CPAP Monday night. Initially struggled with air hunger but last night pressure felt better.   Using hybrid FFM with OTH tubing. So far, she is happy with this interface.   Referral to Pulmonary already placed, scheduled for Jan. 2026.   Current every day smoker.   Titration study also ordered but not  yet scheduled.   ONO not completed.      Having nocturnal leg cramps, but not necessarily RLS.   Component      Latest Ref Rng 04/25/2023   Iron      50 - 170  74    % Saturation      20 - 55  21 (C)   Iron Binding-TIBC      250 - 450  346 (C)   Ferritin      4.63 - 204.00  15.21    She is not taking oral iron.     Biggest complaint is not being able to stay asleep.       Bedtime: 9:30-10pm (melatonin 10mg  30 minutes before bedtime)   Sleep onset latency: 15 minutes   Awakenings: 2-3x (nocturia, stress, dog)    Wake time: 5-5:30am   Naps: only on days off, x60 minutes, wakes feeling more refreshed  Preferred sleep position: right side with body pillow       Epworth Sleepiness Scale Score: (Patient-Rptd) 15 (Compared to a score of 18/24 at LOV.)  Drowsy driving? Sometimes     STOPBANG Score: 6  If score > 3 there is a high probability that they have OSA.       Depression Screening was performed on Hannah Stewart in clinic today. Based on the score of 0, no follow up action or recommendations are necessary at this time.      Works as Paramedic for [employer].              Objective:         acetaZOLAMIDE  (DIAMOX ) 250 mg tablet Take three tablets by mouth twice daily.    ALPRAZolam (XANAX) 0.25 mg tablet Take one tablet by mouth as Needed.      atorvastatin (LIPITOR) 20 mg tablet Take one tablet by mouth at bedtime daily.    busPIRone (BUSPAR) 10 mg tablet Take one tablet by mouth twice daily.    cyclobenzaprine  (FLEXERIL ) 5 mg tablet Take one tablet by mouth at bedtime as needed for Muscle Cramps.    diclofenac potassium (CATAFLAM) 50 mg tablet Take one tablet by mouth daily.    diphenhydrAMINE (BENADRYL) 25 mg capsule Take one capsule by mouth every 6 hours as needed.    duloxetine DR (CYMBALTA) 30 mg capsule Take one capsule by mouth daily.    duloxetine DR (CYMBALTA) 60 mg capsule     levothyroxine (SYNTHROID) 25 mcg tablet Take one tablet by mouth daily.    losartan-hydroCHLOROthiazide (HYZAAR) 100-12.5 mg tablet Take one tablet by mouth daily.    MELATONIN PO Take 10 mg by mouth daily.    methylPREDNIsolone  (MEDROL  (PAK)) 4 mg tablet Take medication as directed on package for 6 days. Take with food. (Patient not taking: Reported on 01/16/2024)    omeprazole DR (PRILOSEC) 40 mg capsule Take one capsule by mouth daily.    OXcarbazepine (TRILEPTAL) 150 mg tablet Take one tablet by mouth twice daily.      Telehealth Patient Reported Vitals       Row Name 01/16/24 1223                Weight: 85.7 kg (189 lb)        Height: 170.2 cm (5' 7)        Pain Score: Four        Pain Location: HIP  Lower Back            Telehealth Body Mass Index:  [phone] at 01/16/2024  9:22 PM     Physical Exam  Alert, no acute distress. Pleasant and cooperative with history and evaluation.          Assessment and Plan:  Problem   Osa (Obstructive Sleep Apnea)    PSG (11/15/23):  TST 401.5 minutes. Sleep efficiency 86.9%.   SOL 3.0 minutes. REM latency 344 minutes. WASO 57.5 minutes. AI 8.7.   AHI 4% 3.6. Supine AHI 6.7. Lateral AHI 2.8. REM AHI 3.5. NREM AHI 3.6.   AHI 3% 6.3. Supine AHI 10.4. Lateral AHI 5.2. REM AHI 12.2. NREM AHI 5.7.   Average SpO2 90.8%. Minimum SpO2 85%. Time spent < 88% 91.9 minutes.   PLMI 0/hr.   IMPRESSION:  Mild obstructive sleep apnea and hypoxemia. The hypoxemia was out of proportion to the severity of the patient's sleep disordered breathing. Of note, supplemental oxygen 1 LPM was added at 2:10 AM which may have masked additional hypopneas.   Sleep architecture showed a decreased sleep onset latency, increased REM latency, and decreased percentage of REM sleep.   Periodic limb movements of sleep (PLMs) were not present.  REM sleep without atonia was not present.           OSA (obstructive sleep apnea)  Hannah Stewart presents today to review results of facility-based sleep study showing mild OSA and hypoxemia. The hypoxemia was out of proportion to the severity of her sleep-disordered breathing.   Discussed the pathophysiology of obstructive sleep apnea and risk factors for this disease. Potential consequences of no treatment include excessive daytime sleepiness/fatigue, cognitive dysfunction, adverse effects on mood, cardiovascular complications such as elevated blood pressure, heart attacks, arrhythmias, stroke, and association with metabolic syndrome. Reviewed treatment options in detail. Discussed with patient that CPAP is is gold standard therapy. Alternative options including mandibular advancement device, and surgical options (such as nasal procedures, upper pharyngeal procedures, MMA, hypoglossal nerve stimulation (INSPIRE) were reviewed. In addition, adjunctive therapies such as weight loss and positional therapy were discussed. Recommend CPAP as first line therapy.   She has already started APAP 6- 20 cm H2O and has been using this for the past 3 nights. Is struggling to acclimate so far but is motivated to continue with PAP therapy to treat her OSA.   She is scheduled for pulmonary evaluation in January 2026. In the meantime, will await results of nocturnal oximetry on CPAP. Smoking cessation discussed. Titration study also ordered, but this has not yet been scheduled.   Various aspects of APAP usage and compliance were discussed with patient. She was advised to use PAP therapy all night, every night, including with naps and travel.   Weight loss should be pursued in the long term management of this patient's sleep disordered breathing.    Reviewed that treating OSA can improve sleep consolidation.     Will plan to follow-up in 2-3 months for compliance visit.        Reviewed results of fasting iron labs showing deficiency with ferritin < 75. Recommended she start oral iron supplement in the form of ferrous sulfate 65mg  once daily + 100mg  vitamin C. Will plan to recheck fasting iron labs in 3 months. No PLMS noted on recent sleep study.

## 2024-01-21 ENCOUNTER — Encounter: Admit: 2024-01-21 | Discharge: 2024-01-21 | Payer: BLUE CROSS/BLUE SHIELD

## 2024-03-15 ENCOUNTER — Encounter: Admit: 2024-03-15 | Discharge: 2024-03-15 | Payer: BLUE CROSS/BLUE SHIELD

## 2024-03-17 ENCOUNTER — Encounter: Admit: 2024-03-17 | Discharge: 2024-03-17 | Payer: BLUE CROSS/BLUE SHIELD

## 2024-03-17 ENCOUNTER — Ambulatory Visit: Admit: 2024-03-17 | Discharge: 2024-03-18 | Payer: BLUE CROSS/BLUE SHIELD

## 2024-03-17 VITALS — BP 148/74 | HR 92 | Ht 67.0 in | Wt 198.0 lb

## 2024-03-17 DIAGNOSIS — G43719 Chronic migraine without aura, intractable, without status migrainosus: Principal | ICD-10-CM

## 2024-03-17 DIAGNOSIS — G932 Benign intracranial hypertension: Secondary | ICD-10-CM

## 2024-03-17 NOTE — Progress Notes
 Date of Service: 03/17/2024    Subjective:             Hannah Stewart is a 58 y.o. female here for follow up due to headaches.     History of Present Illness  Hannah Stewart is a 58 y.o. female with a past medical history of ADHD, depression, back pain, HTN, bilateral CTS s/p surgery and current tobacco use, here for follow up due to chronic headaches. Patient describes headaches for years, they are usually localized on frontal area, pain described as squeezing/pressure sensation, headaches usually last for hours, they are associated with photophobia and nausea, but denies aura or autonomic symptoms with them. Stress and weather changes may trigger headaches but patient denies new triggers besides trying to quit smoking lately. Frequency has been 7 episodes a week for the past few weeks and severity could be up to 5-8/10. Headaches are overall slightly worse lately. Patient denies recent falls, recent mood changes or sleep problems lately, she is using CPAP now. 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 Denies vision loss or significant visual changes. Most recent eye clinic visit (08/2023) was also unremarkable per patient, she also had normal routine labs a few months ago. Patient denies significant weight changes lately (overall lost some weight in the past few years).     Medications:    Preventive:  Diamox  750mg  bid (reports benefit with headaches, denies side effects lately)  Duloxetine 90mg  qday (reports benefit with depression but not with headaches, denies side effects)   Taking Trileptal for depression without side effects and benefit.    Tried Topiramate  (denies benefit with headaches)     Abortive:  Tylenol with some benefit (taking a few tablets per day), denies taking nausea meds, but she takes Benadryl with some benefit for sleep.   Diclofenac (reports some benefit for back pain).   Tried meclizine, but denies benefit.       Objective: acetaZOLAMIDE  (DIAMOX ) 250 mg tablet Take three tablets by mouth twice daily.    ALPRAZolam (XANAX) 0.25 mg tablet Take one tablet by mouth as Needed.      atorvastatin (LIPITOR) 20 mg tablet Take one tablet by mouth at bedtime daily.    busPIRone (BUSPAR) 10 mg tablet Take one tablet by mouth twice daily.    cyclobenzaprine  (FLEXERIL ) 5 mg tablet Take one tablet by mouth at bedtime as needed for Muscle Cramps.    diclofenac potassium (CATAFLAM) 50 mg tablet Take one tablet by mouth daily.    diphenhydrAMINE (BENADRYL) 25 mg capsule Take one capsule by mouth every 6 hours as needed.    duloxetine DR (CYMBALTA) 30 mg capsule Take one capsule by mouth daily.    duloxetine DR (CYMBALTA) 60 mg capsule     levothyroxine (SYNTHROID) 25 mcg tablet Take one tablet by mouth daily.    losartan-hydroCHLOROthiazide (HYZAAR) 100-12.5 mg tablet Take one tablet by mouth daily.    MELATONIN PO Take 10 mg by mouth daily.    methylPREDNIsolone  (MEDROL  (PAK)) 4 mg tablet Take medication as directed on package for 6 days. Take with food. (Patient not taking: Reported on 01/16/2024)    omeprazole DR (PRILOSEC) 40 mg capsule Take one capsule by mouth daily.    OXcarbazepine (TRILEPTAL) 150 mg tablet Take one tablet by mouth twice daily.     Vitals:    03/17/24 1225   BP: (!) 148/74   BP Source: Arm, Left Upper   Pulse: 92  PainSc: Five   Weight: 89.8 kg (198 lb)   Height: 170.2 cm (5' 7)     Body mass index is 31.01 kg/m?SABRA     Physical Exam  GENERAL APPEARANCE: The patient is alert. Patient is in no acute distress, following commands and cooperative. Well developed and well nourished.   HEENT: Normocephalic and atraumatic.     NEUROLOGIC EXAM:   Orientation: The patient is alert and oriented times three. Patient is following commands. Speech is fluent, intact comprehension, repetition and naming.     Cranial nerves:  1st cranial nerve:  not tested   2nd cranial nerve: normal; Visual fields are full to confrontation. Pupils are equal, round, and reactive to light and accommodation. Non mydriatic funduscopic exam with no papilledema.   3rd, 4th & 6th cranial nerves: Extraocular movements are intact without nystagmus.  5th cranial nerve: normal; intact muscles of mastication. Intact light touch and pin prick.  7th cranial nerve: normal; no facial asymmetry  8th cranial nerve: normal  9th & 10th cranial nerves: normal; Gag is present   11th cranial nerve: normal; Shoulder shrug symmetric   12th cranial nerve: normal; tongue is midline.      Strength: (Right/Left) Deltoid 5/5, Biceps 5/5, Triceps 5/5, Finger ext 5/5, interossei 5/5, Hip Flexion 5/5, Knee ext 5/5, Knee flex 5/5, Ankle dorsiflexion 5/5, Ankle plantarflexion 5/5. Normal bulk and tone in all four limbs without any evidence of an arm drift.  No abnormal movements during exam.  Sensory: Intact to pain, temperature and vibration in both upper/lower extremities.   Coordination is intact finger-to-nose.    Deep tendon reflexes are 2+ in biceps, triceps, brachioradialis and patellar bilaterally and 1+ ankle reflex.  Gait is normal based without ataxia.     Assessment and Plan:  Chronic migraines and history of IIH. Patient thinks that headaches have been slightly worse in the last few weeks/months. She has noticed more stress lately, she is also trying to quit smoking. Patient reports some benefit and good tolerance with Diamox  use. She denies recent visual changes or episodes of vision loss, denies recent falls and sleep has improved lately. Neurological exam seems stable today (including non mydriatic funduscopic exam). We will keep same amount of Diamox  (patient noticed worsening symptoms when we tried to wean medication off in the past) and we will also add magnesium to try to optimize headache control.     Recommendations:  We will start magnesium oxide 400mg  qday in addition to Diamox  for headache prophylaxis. The patient was advised about common side effects with new medications. We will also use a limited amount of NSAIDs and Benadryl as abortive medications for headaches.   We advised patient to follow up with ophthalmology in order to monitor findings related to IIH.   Follow-up appointment in 12 months.     Total time was 30 minutes. This time was spent preparing to see the patient, obtaining and/or reviewing history, performing an examination, ordering medications, tests/ procedures, communicating results to the patient, documenting clinical information in the electronic health record and counseling/educating the patient regarding headaches.

## 2024-03-28 ENCOUNTER — Encounter: Admit: 2024-03-28 | Discharge: 2024-03-28 | Payer: BLUE CROSS/BLUE SHIELD

## 2024-04-30 ENCOUNTER — Encounter: Admit: 2024-04-30 | Discharge: 2024-04-30 | Payer: BLUE CROSS/BLUE SHIELD

## 2024-04-30 MED ORDER — ACETAZOLAMIDE 250 MG PO TAB
750 mg | ORAL_TABLET | Freq: Two times a day (BID) | ORAL | 0 refills | 12.50000 days | Status: AC
Start: 2024-04-30 — End: ?

## 2024-05-29 ENCOUNTER — Encounter: Admit: 2024-05-29 | Discharge: 2024-05-29 | Payer: BLUE CROSS/BLUE SHIELD

## 2024-07-06 ENCOUNTER — Encounter: Admit: 2024-07-06 | Discharge: 2024-07-06 | Payer: BLUE CROSS/BLUE SHIELD

## 2024-07-10 ENCOUNTER — Encounter: Admit: 2024-07-10 | Discharge: 2024-07-10 | Payer: BLUE CROSS/BLUE SHIELD

## 2024-07-23 ENCOUNTER — Encounter: Admit: 2024-07-23 | Discharge: 2024-07-23 | Payer: BLUE CROSS/BLUE SHIELD

## 2024-07-27 ENCOUNTER — Encounter: Admit: 2024-07-27 | Discharge: 2024-07-27 | Payer: BLUE CROSS/BLUE SHIELD

## 2024-07-28 ENCOUNTER — Encounter: Admit: 2024-07-28 | Discharge: 2024-07-28 | Payer: BLUE CROSS/BLUE SHIELD

## 2024-07-28 ENCOUNTER — Ambulatory Visit: Admit: 2024-07-28 | Discharge: 2024-07-29 | Payer: BLUE CROSS/BLUE SHIELD

## 2024-07-28 DIAGNOSIS — Z92241 Personal history of systemic steroid therapy: Secondary | ICD-10-CM

## 2024-07-28 DIAGNOSIS — M5416 Radiculopathy, lumbar region: Principal | ICD-10-CM

## 2024-07-28 NOTE — Progress Notes [1]
 [facility]       SUBJECTIVE:   Hannah Stewart is a 59 y.o.-year-old female with history of hypertension, depression, and scoliosis  who presents for follow-up after L4-L5 interlaminar epidural steroid injection on 12/23/23. Patient reports significant relief with the injection. Patient reports at least 75-80% reduction in pain. She reports this lasted until 4-6 weeks ago. She was more functional and able to participated in activities of daily living with less pain. Overall, she is happy with her current level of pain. She is interested in having this repeated. Pain has since recurred.  She continues to have pain in the low back that radiates down both legs. Pain is worse in the right leg now than the left. She endorses numbness and weakness in both legs. This is worse with activity and better with rest. She has not changed her medications. She continues with provider directed home exercises.  Pain is at preprocedure level. VAS pain score is rated a 5/10.  Denies recent falls. Denies any loss of control of bowel or bladder.      Review of Systems  Current Medications[1]  Allergies[2]  Physical Exam      There were no vitals filed for this visit.  Oswestry Total Score:: (Patient-Rptd) 44     There is no height or weight on file to calculate BMI.  General: 59 y.o. female appears stated age, in no acute distress  HEENT: Normocephalic, atraumatic  Neck: No thyroidmegaly  Cardiovascular: Well perfused  Pulmonary: Unlabored respirations  Extremities: No cyanosis, clubbing, or edema  Skin: No lesions seen on exposed skin  Psychiatric:  Appropriate mood and affect  Musculoskeletal: No atrophy.   Neurologic: Antigravity strength in all extremities. CN II -XII grossly intact.  Alert and oriented x 3.      IMPRESSION:  1. Lumbar radiculopathy    2. S/P epidural steroid injection      PLAN:    1.  Lifestyle modifications.  Recommend activity as tolerated.  Avoid provocative maneuvers.  Keep spine in neutral position.  2.  Medications.  No changes indicated at this time.  Continue medications as previously prescribed.  3.  Therapy.  Continue with provider directed home exercises daily.  4.  Imaging.  None indicated at this time.  5.  Interventions.  I would recommend repeating L4-L5 interlaminar epidural steroid injection.  Risk of the procedure were discussed including pain, bleeding, infection, and damage nearby structures.  Patient has elected to proceed.  6.  Follow-up.  Patient to follow-up for repeat injection with Dr. Dow.    Todays visit took place via face-to-face encounter utilizing Zoom application. Total time 20 minutes.  Estimated counseling time 18 minutes.  Counseled Hannah Stewart regarding treatment options.                [1]   Current Outpatient Medications:     acetaZOLAMIDE  (DIAMOX ) 250 mg tablet, TAKE 3 TABLETS BY MOUTH TWICE DAILY, Disp: 180 tablet, Rfl: 0    ALPRAZolam (XANAX) 0.25 mg tablet, Take one tablet by mouth as Needed.  , Disp: , Rfl:     atorvastatin (LIPITOR) 20 mg tablet, Take one tablet by mouth at bedtime daily., Disp: , Rfl:     busPIRone (BUSPAR) 10 mg tablet, Take one tablet by mouth twice daily., Disp: , Rfl:     cyclobenzaprine  (FLEXERIL ) 5 mg tablet, Take one tablet by mouth at bedtime as needed for Muscle Cramps., Disp: 90 tablet, Rfl: 0  diclofenac potassium (CATAFLAM) 50 mg tablet, Take one tablet by mouth daily., Disp: , Rfl:     diphenhydrAMINE (BENADRYL) 25 mg capsule, Take one capsule by mouth every 6 hours as needed., Disp: , Rfl:     duloxetine DR (CYMBALTA) 30 mg capsule, Take one capsule by mouth daily., Disp: , Rfl:     duloxetine DR (CYMBALTA) 60 mg capsule, , Disp: , Rfl:     levothyroxine (SYNTHROID) 25 mcg tablet, Take one tablet by mouth daily., Disp: , Rfl:     losartan-hydroCHLOROthiazide (HYZAAR) 100-12.5 mg tablet, Take one tablet by mouth daily., Disp: , Rfl:     MELATONIN PO, Take 10 mg by mouth daily., Disp: , Rfl:     methylPREDNIsolone  (MEDROL  (PAK)) 4 mg tablet, Take medication as directed on package for 6 days. Take with food., Disp: 21 tablet, Rfl: 0    omeprazole DR (PRILOSEC) 40 mg capsule, Take one capsule by mouth daily., Disp: , Rfl:     OXcarbazepine (TRILEPTAL) 150 mg tablet, Take one tablet by mouth twice daily., Disp: , Rfl:   [2] No Known Allergies

## 2024-08-01 ENCOUNTER — Encounter: Admit: 2024-08-01 | Discharge: 2024-08-01 | Payer: BLUE CROSS/BLUE SHIELD

## 2024-08-03 ENCOUNTER — Encounter: Admit: 2024-08-03 | Discharge: 2024-08-03 | Payer: BLUE CROSS/BLUE SHIELD
